# Patient Record
Sex: Female | Born: 1985 | Race: Black or African American | Hispanic: No | Marital: Single | Smoking: Former smoker
Health system: Southern US, Community
[De-identification: ages and names within clinical notes are randomized; demographics above are authoritative.]

## PROBLEM LIST (undated history)

## (undated) ENCOUNTER — Inpatient Hospital Stay (HOSPITAL_COMMUNITY): Payer: Self-pay

## (undated) DIAGNOSIS — D649 Anemia, unspecified: Secondary | ICD-10-CM

## (undated) DIAGNOSIS — K219 Gastro-esophageal reflux disease without esophagitis: Secondary | ICD-10-CM

## (undated) HISTORY — PX: WISDOM TOOTH EXTRACTION: SHX21

---

## 2007-10-13 ENCOUNTER — Inpatient Hospital Stay (HOSPITAL_COMMUNITY): Admission: AD | Admit: 2007-10-13 | Discharge: 2007-10-13 | Payer: Self-pay | Admitting: Obstetrics & Gynecology

## 2008-03-08 ENCOUNTER — Inpatient Hospital Stay (HOSPITAL_COMMUNITY): Admission: AD | Admit: 2008-03-08 | Discharge: 2008-03-08 | Payer: Self-pay | Admitting: Family Medicine

## 2008-05-17 ENCOUNTER — Emergency Department (HOSPITAL_COMMUNITY): Admission: EM | Admit: 2008-05-17 | Discharge: 2008-05-17 | Payer: Self-pay | Admitting: Emergency Medicine

## 2008-07-28 ENCOUNTER — Ambulatory Visit: Payer: Self-pay | Admitting: Obstetrics and Gynecology

## 2008-09-14 ENCOUNTER — Emergency Department (HOSPITAL_COMMUNITY): Admission: EM | Admit: 2008-09-14 | Discharge: 2008-09-14 | Payer: Self-pay | Admitting: Family Medicine

## 2008-09-14 ENCOUNTER — Ambulatory Visit: Payer: Self-pay | Admitting: Obstetrics and Gynecology

## 2008-09-14 ENCOUNTER — Inpatient Hospital Stay (HOSPITAL_COMMUNITY): Admission: AD | Admit: 2008-09-14 | Discharge: 2008-09-14 | Payer: Self-pay | Admitting: Obstetrics & Gynecology

## 2009-01-30 ENCOUNTER — Inpatient Hospital Stay (HOSPITAL_COMMUNITY): Admission: AD | Admit: 2009-01-30 | Discharge: 2009-01-30 | Payer: Self-pay | Admitting: Obstetrics & Gynecology

## 2009-01-30 ENCOUNTER — Encounter: Payer: Self-pay | Admitting: Emergency Medicine

## 2009-03-04 IMAGING — CR DG CHEST 2V
2 series · 2 of 2 positions shown · non-contrast
Comparison: No priors

CLINICAL DATA: Chest tube and abdominal pain

CHEST - 2 VIEW

[w chest pa]
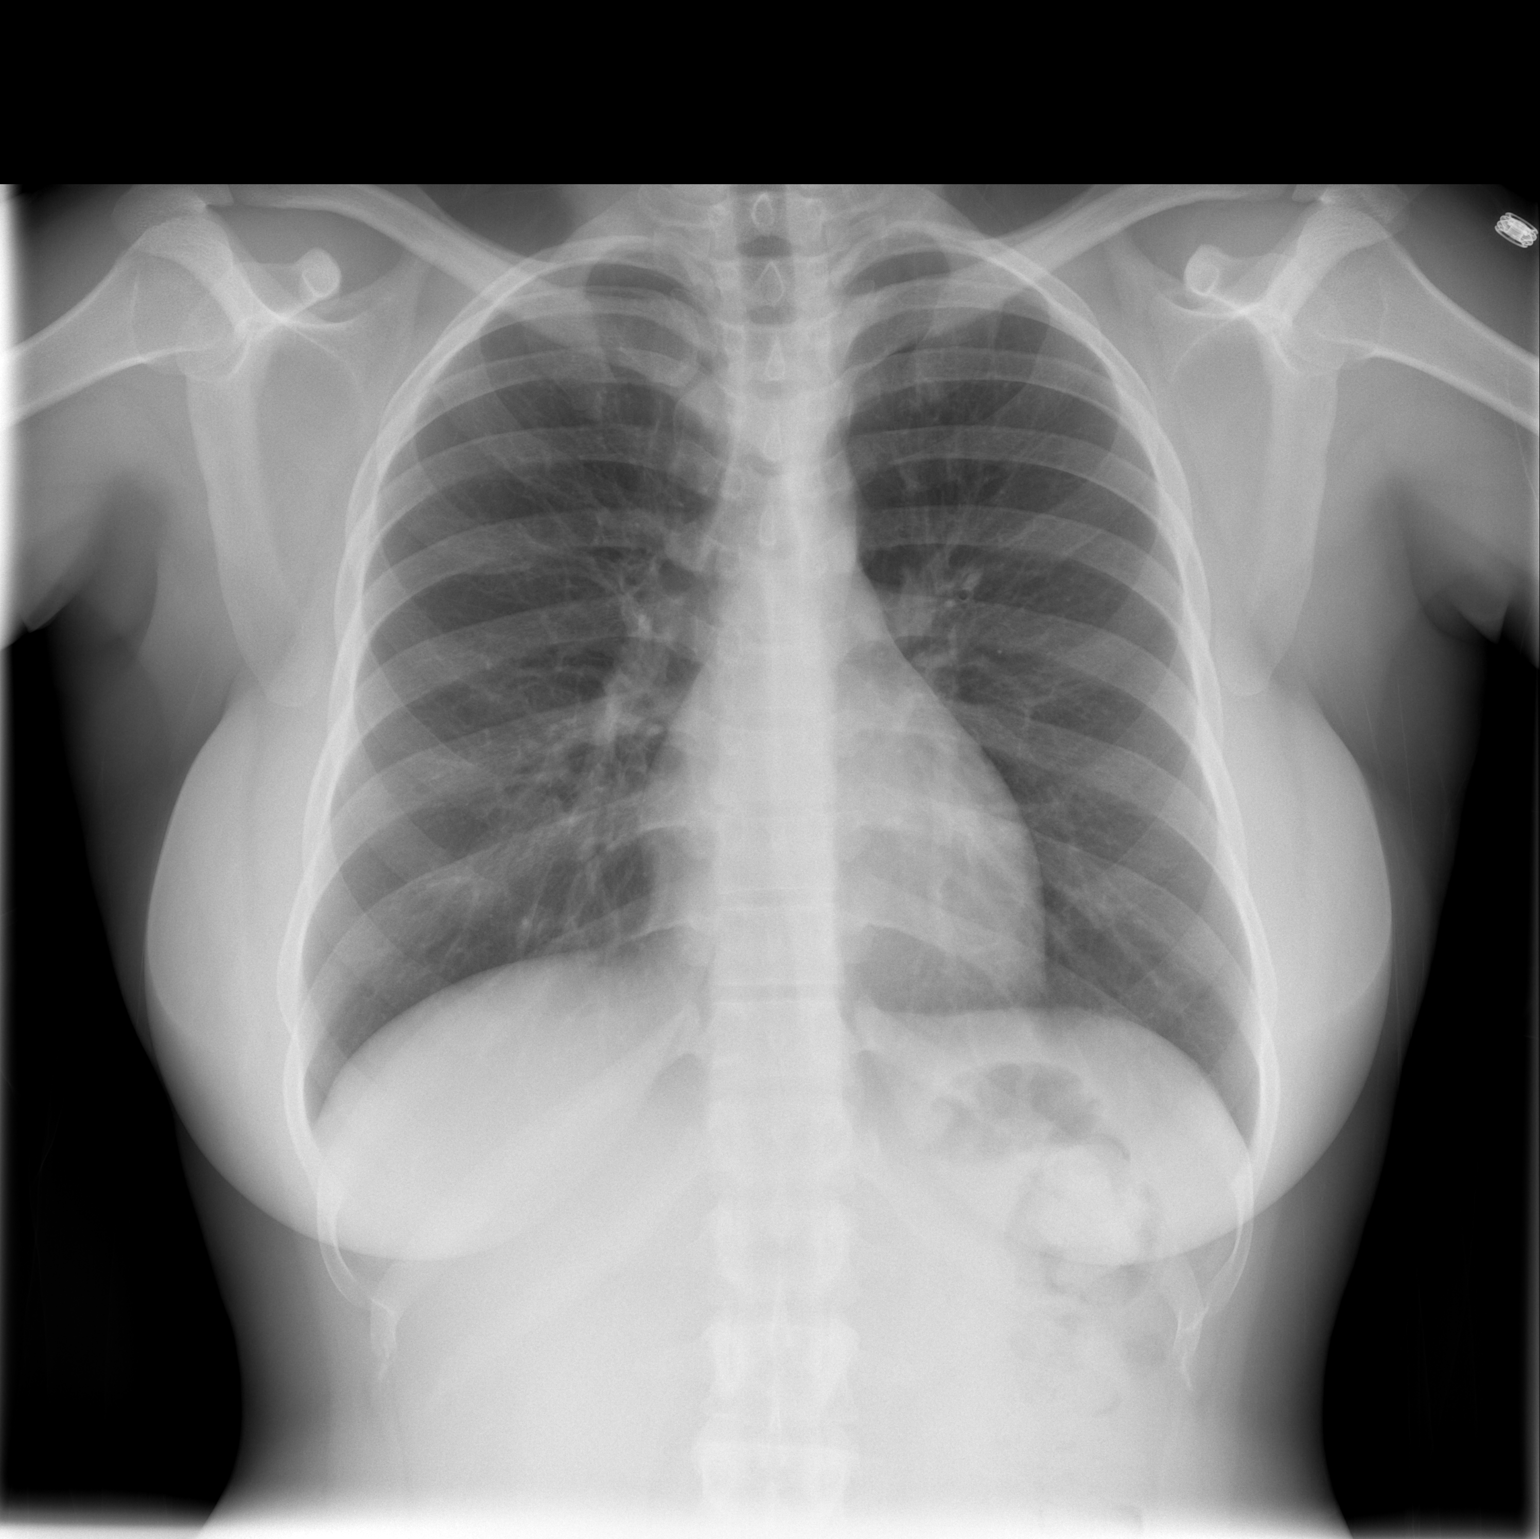

[w chest lat]
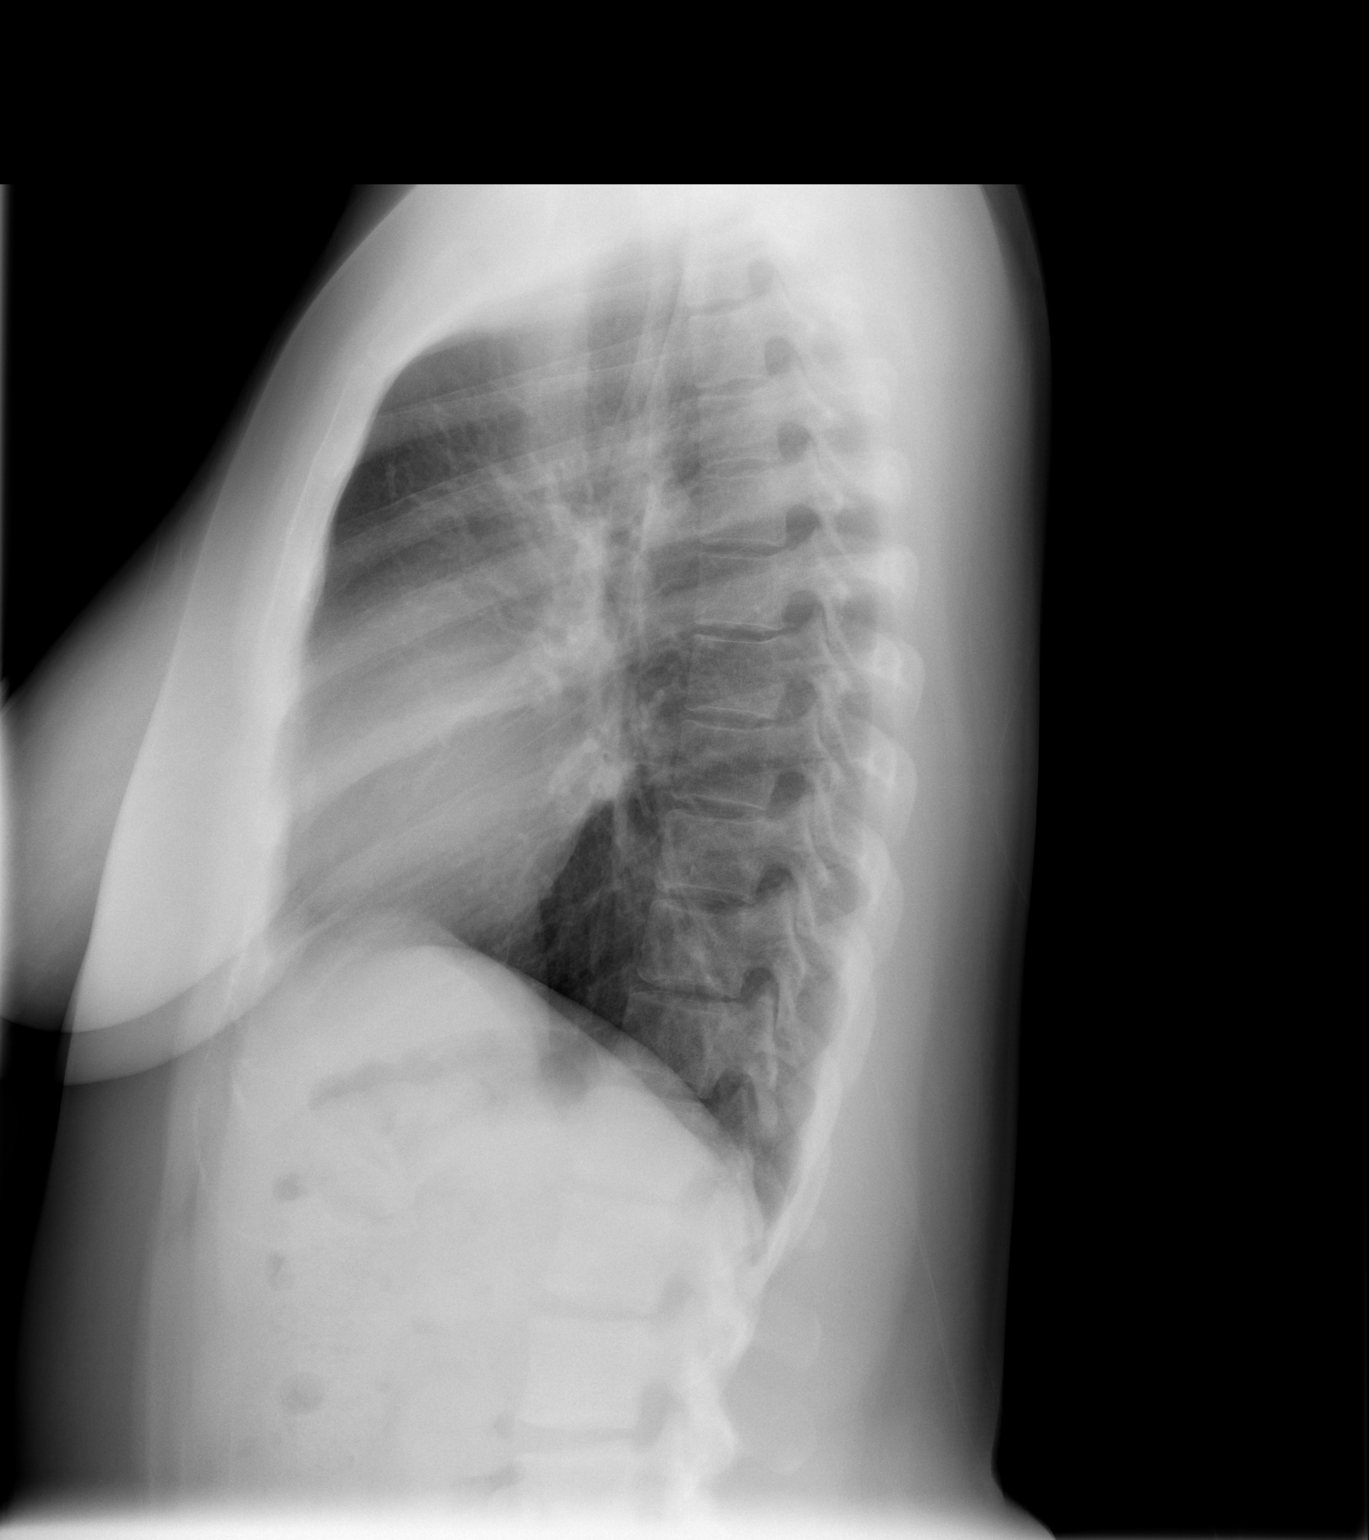

[2 of 2 positions shown; findings below may reference images not displayed]

FINDINGS: Heart and mediastinal contours normal.  Lungs clear.  No
pleural fluid or osseous lesions.
IMPRESSION: No active disease.

## 2009-03-30 ENCOUNTER — Inpatient Hospital Stay (HOSPITAL_COMMUNITY): Admission: AD | Admit: 2009-03-30 | Discharge: 2009-04-01 | Payer: Self-pay | Admitting: Obstetrics and Gynecology

## 2009-07-22 ENCOUNTER — Emergency Department (HOSPITAL_COMMUNITY): Admission: EM | Admit: 2009-07-22 | Discharge: 2009-07-22 | Payer: Self-pay | Admitting: Emergency Medicine

## 2009-11-22 ENCOUNTER — Emergency Department (HOSPITAL_COMMUNITY): Admission: EM | Admit: 2009-11-22 | Discharge: 2009-11-22 | Payer: Self-pay | Admitting: Family Medicine

## 2010-04-25 ENCOUNTER — Ambulatory Visit: Payer: Self-pay | Admitting: Nurse Practitioner

## 2010-04-25 ENCOUNTER — Inpatient Hospital Stay (HOSPITAL_COMMUNITY)
Admission: AD | Admit: 2010-04-25 | Discharge: 2010-04-25 | Payer: Self-pay | Source: Home / Self Care | Admitting: Obstetrics and Gynecology

## 2010-06-13 ENCOUNTER — Emergency Department (HOSPITAL_COMMUNITY): Admission: EM | Admit: 2010-06-13 | Discharge: 2010-06-13 | Payer: Self-pay | Admitting: Emergency Medicine

## 2010-10-08 ENCOUNTER — Inpatient Hospital Stay (HOSPITAL_COMMUNITY)
Admission: AD | Admit: 2010-10-08 | Discharge: 2010-10-08 | Disposition: A | Discharge status: Home / Self Care | Payer: Medicaid Other | Source: Ambulatory Visit | Attending: Obstetrics & Gynecology | Admitting: Obstetrics & Gynecology

## 2010-10-08 ENCOUNTER — Inpatient Hospital Stay (HOSPITAL_COMMUNITY): Payer: Medicaid Other

## 2010-10-08 DIAGNOSIS — O2390 Unspecified genitourinary tract infection in pregnancy, unspecified trimester: Secondary | ICD-10-CM | POA: Insufficient documentation

## 2010-10-08 DIAGNOSIS — R109 Unspecified abdominal pain: Secondary | ICD-10-CM

## 2010-10-08 DIAGNOSIS — N390 Urinary tract infection, site not specified: Secondary | ICD-10-CM | POA: Insufficient documentation

## 2010-10-08 LAB — URINALYSIS, ROUTINE W REFLEX MICROSCOPIC
Ketones, ur: >80 mg/dL — AB
Nitrite: NEGATIVE
Protein, ur: 30 mg/dL — AB
pH: 8.5 — ABNORMAL HIGH (ref 5.0–8.0)

## 2010-10-08 LAB — RAPID URINE DRUG SCREEN, HOSP PERFORMED
Barbiturates: NOT DETECTED
Opiates: NOT DETECTED

## 2010-10-08 LAB — DIFFERENTIAL
Basophils Absolute: 0.1 10*3/uL (ref 0.0–0.1)
Basophils Relative: 1 % (ref 0–1)
Eosinophils Absolute: 0.2 10*3/uL (ref 0.0–0.7)
Eosinophils Relative: 1 % (ref 0–5)
Monocytes Absolute: 0.7 10*3/uL (ref 0.1–1.0)
Monocytes Relative: 6 % (ref 3–12)
Neutro Abs: 8.7 10*3/uL — ABNORMAL HIGH (ref 1.7–7.7)

## 2010-10-08 LAB — CBC
Hemoglobin: 9.9 g/dL — ABNORMAL LOW (ref 12.0–15.0)
MCH: 31.1 pg (ref 26.0–34.0)
MCHC: 33.4 g/dL (ref 30.0–36.0)
RDW: 12.8 % (ref 11.5–15.5)

## 2010-10-08 LAB — ABO/RH: ABO/RH(D): O POS

## 2010-10-08 LAB — RPR: RPR Ser Ql: NONREACTIVE

## 2010-10-08 LAB — HEPATITIS B SURFACE ANTIGEN: Hepatitis B Surface Ag: NEGATIVE

## 2010-10-08 LAB — URINE MICROSCOPIC-ADD ON

## 2010-10-08 LAB — WET PREP, GENITAL

## 2010-10-08 LAB — RUBELLA SCREEN: Rubella: 102.8 IU/mL — ABNORMAL HIGH

## 2010-10-22 LAB — URINE CULTURE: Colony Count: 80000

## 2010-10-25 LAB — URINALYSIS, ROUTINE W REFLEX MICROSCOPIC
Nitrite: NEGATIVE
Protein, ur: NEGATIVE mg/dL
Specific Gravity, Urine: 1.025 (ref 1.005–1.030)
Urobilinogen, UA: 1 mg/dL (ref 0.0–1.0)

## 2010-10-25 LAB — URINE CULTURE
Colony Count: 50000
Culture  Setup Time: 201109150037

## 2010-10-25 LAB — WET PREP, GENITAL

## 2010-10-25 LAB — CBC
HCT: 35.2 % — ABNORMAL LOW (ref 36.0–46.0)
MCH: 32.1 pg (ref 26.0–34.0)
MCHC: 34 g/dL (ref 30.0–36.0)
MCV: 94.3 fL (ref 78.0–100.0)
RDW: 12.3 % (ref 11.5–15.5)
WBC: 13.4 10*3/uL — ABNORMAL HIGH (ref 4.0–10.5)

## 2010-10-25 LAB — URINE MICROSCOPIC-ADD ON

## 2010-10-25 LAB — GC/CHLAMYDIA PROBE AMP, GENITAL: Chlamydia, DNA Probe: NEGATIVE

## 2010-11-17 LAB — CBC
HCT: 36.9 % (ref 36.0–46.0)
Hemoglobin: 10.5 g/dL — ABNORMAL LOW (ref 12.0–15.0)
MCHC: 33.6 g/dL (ref 30.0–36.0)
MCV: 95 fL (ref 78.0–100.0)
Platelets: 288 10*3/uL (ref 150–400)
RDW: 13.1 % (ref 11.5–15.5)
RDW: 13.3 % (ref 11.5–15.5)
WBC: 19.1 10*3/uL — ABNORMAL HIGH (ref 4.0–10.5)

## 2010-11-17 LAB — RPR: RPR Ser Ql: NONREACTIVE

## 2010-11-19 LAB — COMPREHENSIVE METABOLIC PANEL
AST: 21 U/L (ref 0–37)
Albumin: 3 g/dL — ABNORMAL LOW (ref 3.5–5.2)
BUN: 2 mg/dL — ABNORMAL LOW (ref 6–23)
Calcium: 9.2 mg/dL (ref 8.4–10.5)
Chloride: 109 mEq/L (ref 96–112)
Creatinine, Ser: 0.44 mg/dL (ref 0.4–1.2)
GFR calc Af Amer: >60 mL/min (ref >60)
Total Bilirubin: 0.3 mg/dL (ref 0.3–1.2)

## 2010-11-19 LAB — DIFFERENTIAL
Basophils Absolute: 0 10*3/uL (ref 0.0–0.1)
Lymphocytes Relative: 15 % (ref 12–46)
Lymphs Abs: 2 10*3/uL (ref 0.7–4.0)
Monocytes Absolute: 0.7 10*3/uL (ref 0.1–1.0)
Neutro Abs: 10.7 10*3/uL — ABNORMAL HIGH (ref 1.7–7.7)

## 2010-11-19 LAB — URINALYSIS, ROUTINE W REFLEX MICROSCOPIC
Bilirubin Urine: NEGATIVE
Glucose, UA: NEGATIVE mg/dL
Ketones, ur: 40 mg/dL — AB
Nitrite: NEGATIVE
Protein, ur: NEGATIVE mg/dL
Protein, ur: NEGATIVE mg/dL
Specific Gravity, Urine: 1.021 (ref 1.005–1.030)
Urobilinogen, UA: 1 mg/dL (ref 0.0–1.0)
pH: 7 (ref 5.0–8.0)

## 2010-11-19 LAB — WET PREP, GENITAL
Trich, Wet Prep: NONE SEEN
WBC, Wet Prep HPF POC: NONE SEEN
Yeast Wet Prep HPF POC: NONE SEEN

## 2010-11-19 LAB — URINE MICROSCOPIC-ADD ON

## 2010-11-19 LAB — URINE CULTURE

## 2010-11-19 LAB — LIPASE, BLOOD: Lipase: 15 U/L (ref 11–59)

## 2010-11-19 LAB — CBC
HCT: 30 % — ABNORMAL LOW (ref 36.0–46.0)
MCHC: 33.9 g/dL (ref 30.0–36.0)
MCV: 94.3 fL (ref 78.0–100.0)
Platelets: 301 10*3/uL (ref 150–400)
RDW: 12.4 % (ref 11.5–15.5)

## 2010-11-19 LAB — GC/CHLAMYDIA PROBE AMP, GENITAL: Chlamydia, DNA Probe: NEGATIVE

## 2010-11-23 ENCOUNTER — Inpatient Hospital Stay (HOSPITAL_COMMUNITY): Payer: Medicaid Other

## 2010-11-23 ENCOUNTER — Inpatient Hospital Stay (HOSPITAL_COMMUNITY)
Admission: AD | Admit: 2010-11-23 | Discharge: 2010-11-25 | DRG: 775 | Disposition: A | Discharge status: Home / Self Care | Payer: Medicaid Other | Source: Ambulatory Visit | Attending: Obstetrics and Gynecology | Admitting: Obstetrics and Gynecology

## 2010-11-23 ENCOUNTER — Emergency Department (HOSPITAL_COMMUNITY)
Admission: EM | Admit: 2010-11-23 | Discharge: 2010-11-23 | Disposition: A | Discharge status: Skilled Nursing Facility | Payer: Medicaid Other | Attending: Emergency Medicine | Admitting: Emergency Medicine

## 2010-11-23 ENCOUNTER — Other Ambulatory Visit: Payer: Self-pay | Admitting: Obstetrics and Gynecology

## 2010-11-23 DIAGNOSIS — R109 Unspecified abdominal pain: Secondary | ICD-10-CM | POA: Insufficient documentation

## 2010-11-23 DIAGNOSIS — O4700 False labor before 37 completed weeks of gestation, unspecified trimester: Secondary | ICD-10-CM | POA: Insufficient documentation

## 2010-11-23 DIAGNOSIS — O0930 Supervision of pregnancy with insufficient antenatal care, unspecified trimester: Secondary | ICD-10-CM

## 2010-11-23 LAB — CBC
HCT: 34.3 % — ABNORMAL LOW (ref 36.0–46.0)
MCH: 31 pg (ref 26.0–34.0)
MCHC: 32.9 g/dL (ref 30.0–36.0)
MCV: 94 fL (ref 78.0–100.0)
RDW: 12.6 % (ref 11.5–15.5)

## 2010-11-24 LAB — CBC
HCT: 27.5 % — ABNORMAL LOW (ref 36.0–46.0)
Hemoglobin: 9.1 g/dL — ABNORMAL LOW (ref 12.0–15.0)
MCV: 93.2 fL (ref 78.0–100.0)
Platelets: 247 10*3/uL (ref 150–400)
RBC: 2.95 MIL/uL — ABNORMAL LOW (ref 3.87–5.11)
WBC: 19.2 10*3/uL — ABNORMAL HIGH (ref 4.0–10.5)

## 2010-11-27 LAB — POCT PREGNANCY, URINE: Preg Test, Ur: POSITIVE

## 2010-11-27 LAB — WET PREP, GENITAL

## 2010-11-27 LAB — POCT URINALYSIS DIP (DEVICE)
Protein, ur: 30 mg/dL — AB
Specific Gravity, Urine: 1.02 (ref 1.005–1.030)
Urobilinogen, UA: 1 mg/dL (ref 0.0–1.0)

## 2010-11-27 LAB — GC/CHLAMYDIA PROBE AMP, GENITAL: Chlamydia, DNA Probe: NEGATIVE

## 2010-11-29 NOTE — Discharge Summary (Signed)
[name], [name]          ACCOUNT NO.:  [id number]  MEDICAL RECORD NO.:  [id number]           PATIENT TYPE:  I  LOCATION:  9124                          [facility]  PHYSICIAN:  Huel Cote, M.D. DATE OF BIRTH:  05-22-1986  DATE OF ADMISSION:  11/23/2010 DATE OF DISCHARGE:  11/25/2010                              DISCHARGE SUMMARY   DISCHARGE DIAGNOSES: 1. Term pregnancy at 37 plus weeks delivered. 2. The patient had no prenatal care and presented in labor. 3. Status post normal spontaneous vaginal delivery.  DISCHARGE MEDICATIONS: 1. Motrin 600 mg p.o. every 6 hours. 2. Percocet 1-2 tablets p.o. every 4 hours p.r.n. 3. Depo-Provera 150 mg IM prior to discharge.  DISCHARGE FOLLOWUP:  The patient is to follow up in the office in 6 weeks.  HOSPITAL COURSE:  The patient is a 25 year old G4, P 2-0-1-2, who presented to the [facility] ER at 37 plus weeks' gestation.  Her due date was December 09, 2010, by an ultrasound performed in the [facility] Emergency room in September 2011 which demonstrated 7-week gestation, at that point, she had no further prenatal care except for one other ER visit in October 08, 2010.  She did have some basic prenatal labs drawn at that visit and an ultrasound which revealed a baby that was probably slightly growth restricted that had normal anatomy.  She stated that she tried to get in with the health department for prenatal care after that point, but was unsuccessful and got rescheduled twice.  She had a positive marijuana screen noted on her visit in February, but denied any other substance abuse.  Her prenatal labs are as follows: O positive, antibody negative, RPR nonreactive, hepatitis B surface antigen negative, HIV negative, rubella immune, GC negative, chlamydia negative, group B strep was unknown.  PAST OBSTETRICAL HISTORY:  She had one spontaneous miscarriage in 2010. She had a vaginal delivery of a 5-pound 3-ounce infant at term.   In 2001, she had a vaginal delivery of a 5-pound 5-ounce infant at term.  PAST GYN HISTORY:  No abnormal Pap smears.  PAST MEDICAL HISTORY:  Denied by the patient.  PAST SURGICAL HISTORY:  Denied by the patient.  ALLERGIES:  None.  MEDICATIONS:  Tylenol.  SOCIAL:  The patient stays at home.  She denies tobacco use.  She did admit to some marijuana use before she realized she was pregnant and has no other drugs.  Then denied alcohol use.  On admission, she was afebrile with stable vital signs.  She was seen in the emergency room at [facility] and was felt to be possibly laboring with a cervix that was 4-5 cm dilated, and therefore, was transferred to [facility] for care.  She declined epidural, did receive Nubain x2 while in labor and went on to have a normal spontaneous vaginal delivery of a viable female infant over an intact perineum.  Apgars were 8 and 9, weight was 4 pounds 10 ounces.  Placenta delivery was spontaneous and was sent to pathology.  Baby had a strong cry and was vigorous after delivery and was sent to the regular nursery where he was observed closely and  also was going to have urine drug screening.  Estimated blood loss for the patient was 350 mL.  She was then admitted for routine postpartum care. She did have a social work consult and the baby did have drug screening as stated.  On postpartum day #2, she was doing quite well.  She was tolerating her pain with Motrin and Percocet.  She was given prescriptions for these as well as Depo-Provera injection prior to discharge for birth control.  Her bleeding was normal.  She was discharged to home to follow up in the office in 6 weeks for postpartum exam or if desire in the next week and a half for circumcision for her baby.     Huel Cote, M.D.     KR/MEDQ  D:  11/25/2010  T:  11/25/2010  Job:  [id number]  Electronically Signed by Huel Cote M.D. on 11/29/2010 06:55:36 PM

## 2010-12-25 NOTE — Group Therapy Note (Signed)
[name], COONROD           ACCOUNT NO.:  [id number]   MEDICAL RECORD NO.:  [id number]          PATIENT TYPE:  WOC   LOCATION:  [facility]                   FACILITY:  [facility]   PHYSICIAN:  Argentina Donovan, MD        DATE OF BIRTH:  08-08-1986   DATE OF SERVICE:  07/28/2008                                  CLINIC NOTE   CHIEF COMPLAINT:  Irregular periods.   HISTORY OF PRESENT ILLNESS:  This is a 25 year old female who presents  for 6 months of irregular bleeding.  Her last normal period was in June,  2009.  Then for the next 6 months, she had a day or 2 of light pink  spotting about once a month.  Then during November, on July 08, 2008.  She started to have a very heavy, painful period.  She was  passing clots that lasted 4 days.  She has not had any bleeding since  that time.  She was seen in the ER for this irregular bleeding, where  they did a Pap smear, which per the patient was normal.  Her pregnancy  test was negative.   She is trying to get pregnant and has been actively trying for about 2  months.  She was not on any form of hormone or contraception prior to  this.  She denies any current abdominal pain or vaginal discharge.   MENSTRUAL HISTORY:  Her menstrual cycles began at 11.  She had normal  menstrual cycles until June of this year.   OBSTETRICAL HISTORY:  Patient is a G2, P1-0-0-1.  Her last pregnancy was  7 years ago.  It was a normal spontaneous vaginal delivery without  complication.  She had 1 miscarriage, 2 years ago.   GYNECOLOGIC HISTORY:  Her last Pap smear was on November 26th in the  emergency room in [place].  She reports that this was normal.  She had  an abnormal Pap smear 3 years ago and had a repeat Pap following this  that was normal.   ALLERGIES:  No known drug allergies.   CURRENT MEDICATIONS:  Prilosec as needed.   FAMILY HISTORY:  Positive for diabetes and high blood pressure in her  grandmother.   SOCIAL HISTORY:  Patient recently  moved to [place] from [place].  She does not smoke, drink alcohol, or use drugs.   Ten systems reviewed and were all negative.   PHYSICAL EXAMINATION:  Temperature 96.9, pulse 82, blood pressure  117/68, weight 147.9 pounds.  Height is 5 foot 0.  GENERAL:  Patient is an overweight, pleasant female in no acute  distress.  GYN:  Patient has normal external genitalia, normal vaginal mucosa  without discharge.  No cervical lesions or tenderness present.  No  vaginal bleeding present.  Uterus is normal size and nontender.  Ovaries  without masses or tenderness.   ASSESSMENT:  This is a 25 year old female who presents for dysfunctional  uterine bleeding.   PLAN:  We will check a thyroid hormone to insure this is not the cause  of her regular bleeding.  It is reassuring that she had a heavy  withdrawal  bleed in November.  She is trying to get pregnant.  It is  advised that she start prenatal vitamins and keep a record of her sexual  activity and of her menstrual cycles for the next 3-4 months and likely,  she will start to ovulate regularly and will be successful  in getting  pregnant.  We will consider any further workup when she returns in 3-4  months if she continues to have irregular periods.     ______________________________  Argentina Donovan, MD    ______________________________  Argentina Donovan, MD    PR/MEDQ  D:  07/28/2008  T:  07/28/2008  Job:  [id number]

## 2010-12-25 NOTE — Discharge Summary (Signed)
[name], [name]          ACCOUNT NO.:  [id number]   MEDICAL RECORD NO.:  [id number]          PATIENT TYPE:  INP   LOCATION:  9104                          [facility]   PHYSICIAN:  Huel Cote, M.D. DATE OF BIRTH:  25-Feb-1986   DATE OF ADMISSION:  03/30/2009  DATE OF DISCHARGE:  04/01/2009                               DISCHARGE SUMMARY   ADDENDUM:  The patient stayed until April 01, 2009, for the pediatric  request that the baby stay an additional day.  On postpartum day #2, the  patient was still doing quite well.  She had no complaints and was ready  for discharge.  Fundus was firm.  She had been given prescriptions and  instructions by Dr. Ellyn Hack previously with Motrin and Vicodin ordered on  her prescriptions.  She was instructed on pelvic rest and to follow up  in the office in 6 weeks for her full postpartum exam.      Huel Cote, M.D.  Electronically Signed     KR/MEDQ  D:  04/01/2009  T:  04/02/2009  Job:  [id number]

## 2010-12-25 NOTE — Discharge Summary (Signed)
[name], [name]          ACCOUNT NO.:  [id number]   MEDICAL RECORD NO.:  [id number]          PATIENT TYPE:  INP   LOCATION:  9104                          [facility]   PHYSICIAN:  Sherron Monday, MD        DATE OF BIRTH:  [birth year]   DATE OF ADMISSION:  03/30/2009  DATE OF DISCHARGE:                               DISCHARGE SUMMARY   ADMITTING DIAGNOSIS:  Intrauterine pregnancy in active labor.   DISCHARGE DIAGNOSIS:  Intrauterine pregnancy in active labor, delivered.   HISTORY OF PRESENT ILLNESS:  A 25 year old, G3, P1-0-1-1, at 37 plus  weeks in active labor.  She states she has had good fetal movement, no  loss of fluid, no vaginal bleeding.  Her contractions have increased in  intensity and frequency.  To this point, she has had uncomplicated  prenatal care and presents to the [facility] with cervical change to 6 cm from  a check in our office.   Past medical history is significant for acid reflux.   Surgical history is not significant.   PAST OB-GYN HISTORY:  G1 was term vaginal delivery of a female infant  without complications.  G2 was a miscarriage.  G3 is present pregnancy.  She has a history of an abnormal Pap smear with repeat followup normal.  No history of any sexually transmitted diseases.   Medications include prenatal vitamins and Tylenol p.r.n.   ALLERGIES:  No known drug allergies.   SOCIAL HISTORY:  The patient denies alcohol, tobacco, or drug use.  She  is single and a homemaker.   Family history is significant for hypertension in maternal grandmother  and coronary artery disease in maternal grandmother.   PRENATAL LABORATORY:  Hemoglobin 10.8, platelets 314,000.  O positive,  antibody screen negative.  Sickle cell normal limits.  Pap smear is  within normal limits.  Gonorrhea negative.  Chlamydia negative.  RPR  nonreactive.  Rubella immune.  Hepatitis C surface antigen negative.  HIV negative.  AFP negative.  First trimester screen within normal  limits.  AFP within normal limits.  Glucola of 89.  Group B strep was  negative.  She had normal ultrasound including a normal nuchal  thickness.  She had a scan in 19 weeks revealing normal anatomy, except  limited base, anterior placenta, female infant.  Followup ultrasound on  22 weeks revealed normal anatomy, anterior placenta, female infant was  consistent with an EDC of April 14, 2009.   PHYSICAL EXAMINATION:  She is afebrile.  Vital signs stable with a  benign exam.  Fetal heart tones are in the 120s and reactive with  contractions every 3 to 4 minutes.   On exam by the MD, she was 8-9 cm dilated, 90% effaced, and 0 to +1  station.  Rupture of membranes was performed without complications for  clear fluid.  The patient progressed rapidly to the anterior lip,  complete +2, complaining of severe pressure.  At this time, she pushed  twice and the anterior lip was reduced.  She pushed for 3 more  contractions to deliver a viable female infant at 5:40 a.m. with Apgars  of 8 at 1 minute and 9 at 5 minutes and a weight of 5 pounds 4 ounces.  Infant delivered from the OA presentation.  Placenta was expressed  intact at 5:43.  No OB lacerations except a left labial abrasion that  was hemostatic.  EBL was approximately 500 mL.  Her postpartum course  was relatively uncomplicated.  She remained afebrile.  Vital signs  stable with normal lochia.  Pain was well controlled.  She was  ambulating well and voiding.  Her hemoglobin decreased from 12.4 to  10.5.  She desired discharge to home on postpartum day #1 as well as the  baby is discharged.  She will be discharged with routine instructions  and numbers to call with any questions or problems as well as  prescriptions for Motrin, Vicodin, and prenatal vitamins.  She will  follow up in approximately 6 weeks.   DISCHARGE INFORMATION:  She plans to bottle feed.  She will started OCPs  at her 6-week checkup.  She is O positive, rubella  immune, and her  hemoglobin decreased from 12.4-10.5.  n      Sherron Monday, MD  Electronically Signed     JB/MEDQ  D:  03/31/2009  T:  04/01/2009  Job:  [id number]

## 2011-05-06 LAB — URINALYSIS, ROUTINE W REFLEX MICROSCOPIC
Glucose, UA: NEGATIVE
Hgb urine dipstick: NEGATIVE
Ketones, ur: 15 — AB
Protein, ur: NEGATIVE

## 2011-05-06 LAB — COMPREHENSIVE METABOLIC PANEL
BUN: 9
CO2: 28
Calcium: 9.2
Creatinine, Ser: 0.54
GFR calc non Af Amer: >60
Glucose, Bld: 95

## 2011-05-06 LAB — CBC
HCT: 34.4 — ABNORMAL LOW
Hemoglobin: 11.7 — ABNORMAL LOW
MCHC: 33.9
MCV: 93.7
RBC: 3.68 — ABNORMAL LOW

## 2011-05-06 LAB — WET PREP, GENITAL: Trich, Wet Prep: NONE SEEN

## 2011-05-10 LAB — COMPREHENSIVE METABOLIC PANEL
BUN: 7
CO2: 26
Chloride: 105
Creatinine, Ser: 0.53
GFR calc non Af Amer: >60
Glucose, Bld: 87
Total Bilirubin: 0.4

## 2011-05-10 LAB — URINALYSIS, ROUTINE W REFLEX MICROSCOPIC
Glucose, UA: NEGATIVE
Protein, ur: NEGATIVE
Urobilinogen, UA: 0.2

## 2011-05-10 LAB — WET PREP, GENITAL: Yeast Wet Prep HPF POC: NONE SEEN

## 2011-05-10 LAB — CBC
HCT: 34 — ABNORMAL LOW
MCV: 94.8
RBC: 3.59 — ABNORMAL LOW
WBC: 10

## 2011-05-10 LAB — POCT PREGNANCY, URINE: Preg Test, Ur: NEGATIVE

## 2011-05-14 LAB — POCT I-STAT, CHEM 8
BUN: 7
Calcium, Ion: 1.32
Creatinine, Ser: 0.7
Glucose, Bld: 81
Hemoglobin: 12.9
Sodium: 140
TCO2: 27

## 2011-05-14 LAB — HEPATIC FUNCTION PANEL
ALT: 16
AST: 23
Alkaline Phosphatase: 36 — ABNORMAL LOW
Bilirubin, Direct: 0.1
Total Bilirubin: 0.5

## 2011-05-14 LAB — CBC
Hemoglobin: 11.6 — ABNORMAL LOW
MCHC: 33.6
RBC: 3.68 — ABNORMAL LOW
WBC: 8.8

## 2011-05-14 LAB — URINE MICROSCOPIC-ADD ON

## 2011-05-14 LAB — DIFFERENTIAL
Basophils Relative: 1
Lymphs Abs: 2.5
Monocytes Absolute: 0.7
Monocytes Relative: 8
Neutro Abs: 5.1

## 2011-05-14 LAB — URINALYSIS, ROUTINE W REFLEX MICROSCOPIC
Glucose, UA: NEGATIVE
Ketones, ur: NEGATIVE
Leukocytes, UA: NEGATIVE
Nitrite: NEGATIVE
Protein, ur: NEGATIVE

## 2011-05-14 LAB — POCT PREGNANCY, URINE: Preg Test, Ur: NEGATIVE

## 2011-11-09 ENCOUNTER — Encounter (HOSPITAL_COMMUNITY): Payer: Self-pay

## 2011-11-09 ENCOUNTER — Emergency Department (HOSPITAL_COMMUNITY): Payer: Medicaid Other

## 2011-11-09 ENCOUNTER — Other Ambulatory Visit: Payer: Self-pay

## 2011-11-09 ENCOUNTER — Emergency Department (HOSPITAL_COMMUNITY)
Admission: EM | Admit: 2011-11-09 | Discharge: 2011-11-09 | Disposition: A | Discharge status: Home / Self Care | Payer: Medicaid Other | Attending: Emergency Medicine | Admitting: Emergency Medicine

## 2011-11-09 DIAGNOSIS — R0789 Other chest pain: Secondary | ICD-10-CM

## 2011-11-09 DIAGNOSIS — R071 Chest pain on breathing: Secondary | ICD-10-CM | POA: Insufficient documentation

## 2011-11-09 DIAGNOSIS — F17200 Nicotine dependence, unspecified, uncomplicated: Secondary | ICD-10-CM | POA: Insufficient documentation

## 2011-11-09 DIAGNOSIS — Z79899 Other long term (current) drug therapy: Secondary | ICD-10-CM | POA: Insufficient documentation

## 2011-11-09 DIAGNOSIS — R51 Headache: Secondary | ICD-10-CM

## 2011-11-09 MED ORDER — IBUPROFEN 800 MG PO TABS: 800.0000 mg | ORAL_TABLET | Freq: Three times a day (TID) | ORAL | Status: AC

## 2011-11-09 MED ORDER — KETOROLAC TROMETHAMINE 60 MG/2ML IM SOLN
60.0000 mg | Freq: Once | INTRAMUSCULAR | Status: AC
  Administered 2011-11-09: 60 mg via INTRAMUSCULAR
  Filled 2011-11-09: qty 2

## 2011-11-09 NOTE — ED Notes (Signed)
Migraine began yesterday  and chest pain began about 1 week ago .

## 2011-11-09 NOTE — ED Notes (Signed)
Patient states she is having chest pain x 1 week and headache x 3 days. Patient states when she picks up her son her chest hurts. Patient states D/Vx 2 days and states she is depovera shot and does not think she is pregnant.

## 2011-11-09 NOTE — ED Provider Notes (Signed)
History     CSN: [id number]  Arrival date & time 11/09/11  6440   First MD Initiated Contact with Patient 11/09/11 [id number]      Chief Complaint  Patient presents with  . Migraine    (Consider location/radiation/quality/duration/timing/severity/associated sxs/prior treatment) HPI History from patient. 26 year old female who is otherwise healthy presents with complaint of headache and chest pain. She states that she developed the chest pain about a week ago. Pain is sharp and stabbing and lasts a few seconds at a time, located to R chest wall. This seems to get worse with movement and when she is lifting her child. States it does feel somewhat like a muscle strain. Occasionally does seem to be worse with deep breaths but not consistently. It is not positional in nature. Denies associated shortness of breath, nausea, vomiting. No associated abdominal pain. No treatments prior to arrival.  Patient has no prior history of DVT/PE or clotting disorders. Denies recent trauma, surgery, or prolonged immobilization. Denies hemoptysis, leg swelling. She is on Depo.  She additionally complains of headache which started yesterday. The headache is unilateral, located in the frontal area and behind her right eye. It is described as throbbing in nature. Seems to vary in intensity with no known aggravating factors. She is taking Tylenol which did not successfully alleviate the pain. No associated nausea, vomiting, photophobia, phonophobia. She has not had any neck pain or stiffness. No fever or chills. No history of migraine, trauma, or recent URI sx. No weakness, numbness, myalgias.  History reviewed. No pertinent past medical history.  No past surgical history on file.  No family history on file.  History  Substance Use Topics  . Smoking status: Current Everyday Smoker  . Smokeless tobacco: Not on file  . Alcohol Use: No    OB History    Grav Para Term Preterm Abortions TAB SAB Ect Mult Living               Review of Systems as per HPI  Allergies  Review of patient's allergies indicates no known allergies.  Home Medications   Current Outpatient Rx  Name Route Sig Dispense Refill  . ACETAMINOPHEN 500 MG PO TABS Oral Take 1,000 mg by mouth every 6 (six) hours as needed. For pain    . HYDROCORTISONE 1 % EX CREA Topical Apply 1 application topically as needed. For eczema on face    . MEDROXYPROGESTERONE ACETATE 150 MG/ML IM SUSP Intramuscular Inject 150 mg into the muscle every 3 (three) months. Last injection February 13th, 2013      BP 126/67  Pulse 72  Temp(Src) 98.6 F (37 C) (Oral)  Resp 18  SpO2 100%  Physical Exam  Nursing note and vitals reviewed. Constitutional: She is oriented to person, place, and time. She appears well-developed and well-nourished. No distress.       nontox  HENT:  Head: Normocephalic and atraumatic.  Right Ear: External ear normal.  Left Ear: External ear normal.  Nose: Nose normal.  Mouth/Throat: Oropharynx is clear and moist. No oropharyngeal exudate.       Head not TTP, no sinus tenderness, TMs nl b/l  Eyes: Conjunctivae and EOM are normal. Pupils are equal, round, and reactive to light.  Neck: Normal range of motion. Neck supple.       No meningeal signs  Cardiovascular: Normal rate, regular rhythm and normal heart sounds.   Pulmonary/Chest: Effort normal and breath sounds normal. No respiratory distress. She has no wheezes. She exhibits tenderness.  Reproducible chest pain to R anterior upper chest  Abdominal: Soft. Bowel sounds are normal. There is no tenderness.  Musculoskeletal: Normal range of motion. She exhibits no edema.       No periph edema, LEs soft and NT to palp, no palp cord  Lymphadenopathy:    She has no cervical adenopathy.  Neurological: She is alert and oriented to person, place, and time. She displays normal reflexes. No cranial nerve deficit. She exhibits normal muscle tone. Coordination normal.    Skin: Skin is warm and dry. No rash noted. She is not diaphoretic.  Psychiatric: She has a normal mood and affect.    ED Course  Procedures (including critical care time)   Date: 11/09/2011  Rate: 80  Rhythm: normal sinus rhythm  QRS Axis: normal  Intervals: normal  ST/T Wave abnormalities: flipped T waves in V1 only, no other ST changes  Conduction Disutrbances:none  Narrative Interpretation:   Old EKG Reviewed: compared with June 2010, T wave change not seen but otherwise unchanged   Labs Reviewed  PREGNANCY, URINE   Dg Chest 2 View  11/09/2011  *RADIOLOGY REPORT*  Clinical Data: Right-sided chest pain for the past week.  Shortness of breath.  CHEST - 2 VIEW  Comparison: Chest x-ray 05/17/2008.  Findings: Lung volumes are normal.  No consolidative airspace disease.  No pleural effusions.  No pneumothorax.  No pulmonary nodule or mass noted.  Pulmonary vasculature and the cardiomediastinal silhouette are within normal limits.  IMPRESSION: 1. No radiographic evidence of acute cardiopulmonary disease.  Original Report Authenticated By: Florencia Reasons, M.D.     1. Chest wall pain   2. Headache       MDM  Patient with complaint of chest pain for the past week as well as headache for the past day. Her chest pain is very reproducible to palpation. Her EKG does not have any worrisome changes as compared with old. Chest x-ray normal. She has no risk factors for PE other than exogenous estrogen use. Her vital signs are normal. The pain does not truly seem pleuritic in nature. Therefore, I do not have high suspicion for PE and did not feel that further investigation was necessary.  Regarding her headache, she does not have any neurologic findings on exam. Seems to be likely tension headache given history. Do not feel that she requires imaging at this time.  She was given Toradol in the department which she stated completely relieved her headache and vastly improved her chest pain.  On repeat exam, she was noted to have considerably less tenderness to palpation to her chest. Will treat symptomatically with ibuprofen. Return precautions discussed.       Grant Fontana, [place] 11/09/11 1234

## 2011-11-09 NOTE — ED Notes (Signed)
Patient transported to x-ray. ?

## 2011-11-09 NOTE — Discharge Instructions (Signed)
Your chest xray and EKG did not show any worrisome findings. Your chest pain is likely chest wall pain related to the muscles/cartilage in your chest. Use the ibuprofen as needed for pain. If your symptoms worsen, please return to the ER.  Chest Wall Pain Chest wall pain is pain in or around the bones and muscles of your chest. It may take up to 6 weeks to get better. It may take longer if you must stay physically active in your work and activities.  CAUSES  Chest wall pain may happen on its own. However, it may be caused by:  A viral illness like the flu.   Injury.   Coughing.   Exercise.   Arthritis.   Fibromyalgia.   Shingles.  HOME CARE INSTRUCTIONS   Avoid overtiring physical activity. Try not to strain or perform activities that cause pain. This includes any activities using your chest or your abdominal and side muscles, especially if heavy weights are used.   Put ice on the sore area.   Put ice in a plastic bag.   Place a towel between your skin and the bag.   Leave the ice on for 15 to 20 minutes per hour while awake for the first 2 days.   Only take over-the-counter or prescription medicines for pain, discomfort, or fever as directed by your caregiver.  SEEK IMMEDIATE MEDICAL CARE IF:   Your pain increases, or you are very uncomfortable.   You have a fever.   Your chest pain becomes worse.   You have new, unexplained symptoms.   You have nausea or vomiting.   You feel sweaty or lightheaded.   You have a cough with phlegm (sputum), or you cough up blood.  MAKE SURE YOU:   Understand these instructions.   Will watch your condition.   Will get help right away if you are not doing well or get worse.  Document Released: 07/29/2005 Document Revised: 07/18/2011 Document Reviewed: 03/25/2011 [facility] Patient Information 2012 [place], [place].Headache, General, Unknown Cause The specific cause of your headache may not have been found today. There are many  causes and types of headache. A few common ones are:  Tension headache.   Migraine.   Infections (examples: dental and sinus infections).   Bone and/or joint problems in the neck or jaw.   Depression.   Eye problems.  These headaches are not life threatening.  Headaches can sometimes be diagnosed by a patient history and a physical exam. Sometimes, lab and imaging studies (such as x-ray and/or CT scan) are used to rule out more serious problems. In some cases, a spinal tap (lumbar puncture) may be requested. There are many times when your exam and tests may be normal on the first visit even when there is a serious problem causing your headaches. Because of that, it is very important to follow up with your doctor or local clinic for further evaluation. FINDING OUT THE RESULTS OF TESTS  If a radiology test was performed, a radiologist will review your results.   You will be contacted by the emergency department or your physician if any test results require a change in your treatment plan.   Not all test results may be available during your visit. If your test results are not back during the visit, make an appointment with your caregiver to find out the results. Do not assume everything is normal if you have not heard from your caregiver or the medical facility. It is important for you to  follow up on all of your test results.  HOME CARE INSTRUCTIONS   Keep follow-up appointments with your caregiver, or any specialist referral.   Only take over-the-counter or prescription medicines for pain, discomfort, or fever as directed by your caregiver.   Biofeedback, massage, or other relaxation techniques may be helpful.   Ice packs or heat applied to the head and neck can be used. Do this three to four times per day, or as needed.   Call your doctor if you have any questions or concerns.   If you smoke, you should quit.  SEEK MEDICAL CARE IF:   You develop problems with medications  prescribed.   You do not respond to or obtain relief from medications.   You have a change from the usual headache.   You develop nausea or vomiting.  SEEK IMMEDIATE MEDICAL CARE IF:   If your headache becomes severe.   You have an unexplained oral temperature above 102 F (38.9 C), or as your caregiver suggests.   You have a stiff neck.   You have loss of vision.   You have muscular weakness.   You have loss of muscular control.   You develop severe symptoms different from your first symptoms.   You start losing your balance or have trouble walking.   You feel faint or pass out.  MAKE SURE YOU:   Understand these instructions.   Will watch your condition.   Will get help right away if you are not doing well or get worse.  Document Released: 07/29/2005 Document Revised: 07/18/2011 Document Reviewed: 03/17/2008 [facility] Patient Information 2012 [place], [place].

## 2011-11-09 NOTE — ED Notes (Signed)
Patient returned from xray.

## 2011-11-10 NOTE — ED Provider Notes (Signed)
Medical screening examination/treatment/procedure(s) were performed by non-physician practitioner and as supervising physician I was immediately available for consultation/collaboration.  Toy Baker, MD 11/10/11 [phone]

## 2012-12-06 LAB — OB RESULTS CONSOLE HIV ANTIBODY (ROUTINE TESTING): HIV: NONREACTIVE

## 2012-12-06 LAB — OB RESULTS CONSOLE HEPATITIS B SURFACE ANTIGEN: HEP B S AG: NEGATIVE

## 2012-12-06 LAB — OB RESULTS CONSOLE RPR: RPR: NONREACTIVE

## 2012-12-06 LAB — OB RESULTS CONSOLE ANTIBODY SCREEN: Antibody Screen: NEGATIVE

## 2012-12-06 LAB — OB RESULTS CONSOLE GC/CHLAMYDIA
Chlamydia: NEGATIVE
Gonorrhea: NEGATIVE

## 2012-12-06 LAB — OB RESULTS CONSOLE ABO/RH: RH TYPE: POSITIVE

## 2012-12-06 LAB — OB RESULTS CONSOLE RUBELLA ANTIBODY, IGM: Rubella: IMMUNE

## 2013-08-12 NOTE — L&D Delivery Note (Signed)
Delivery Note Pt progressed to complete dilation and pushed well.  SROM occurred at some point after admitted.  At 10:50 AM a healthy female was delivered via  NSVD(Presentation: LOA).  APGAR:8 , 9; weight pending .   Placenta status: delivered spontaneously, .  Cord:  with the following complications: tight nuchal x 1 delivered through .    Anesthesia: None  Episiotomy: none Lacerations: small right periurethral, hemostatic Suture Repair:  n/a Est. Blood Loss (mL): 300cc  Mom to postpartum.  Baby to Couplet care / Skin to Skin.  Oliver Pila 01/09/2014, 11:03 AM

## 2013-11-28 ENCOUNTER — Inpatient Hospital Stay (HOSPITAL_COMMUNITY)
Admission: AD | Admit: 2013-11-28 | Discharge: 2013-11-29 | Disposition: A | Discharge status: Home / Self Care | Payer: Medicaid Other | Source: Ambulatory Visit | Attending: Obstetrics and Gynecology | Admitting: Obstetrics and Gynecology

## 2013-11-28 ENCOUNTER — Encounter (HOSPITAL_COMMUNITY): Payer: Self-pay | Admitting: *Deleted

## 2013-11-28 DIAGNOSIS — O4700 False labor before 37 completed weeks of gestation, unspecified trimester: Secondary | ICD-10-CM | POA: Insufficient documentation

## 2013-11-28 DIAGNOSIS — O2390 Unspecified genitourinary tract infection in pregnancy, unspecified trimester: Secondary | ICD-10-CM | POA: Insufficient documentation

## 2013-11-28 DIAGNOSIS — O0930 Supervision of pregnancy with insufficient antenatal care, unspecified trimester: Secondary | ICD-10-CM

## 2013-11-28 DIAGNOSIS — O99330 Smoking (tobacco) complicating pregnancy, unspecified trimester: Secondary | ICD-10-CM | POA: Insufficient documentation

## 2013-11-28 DIAGNOSIS — A499 Bacterial infection, unspecified: Secondary | ICD-10-CM | POA: Insufficient documentation

## 2013-11-28 DIAGNOSIS — B9689 Other specified bacterial agents as the cause of diseases classified elsewhere: Secondary | ICD-10-CM | POA: Insufficient documentation

## 2013-11-28 DIAGNOSIS — O479 False labor, unspecified: Secondary | ICD-10-CM

## 2013-11-28 DIAGNOSIS — R109 Unspecified abdominal pain: Secondary | ICD-10-CM | POA: Insufficient documentation

## 2013-11-28 DIAGNOSIS — R079 Chest pain, unspecified: Secondary | ICD-10-CM | POA: Insufficient documentation

## 2013-11-28 DIAGNOSIS — N760 Acute vaginitis: Secondary | ICD-10-CM | POA: Insufficient documentation

## 2013-11-28 NOTE — MAU Note (Signed)
Pt had her last depot inj in September, had bleeding in December.  + UPT at home.  Pt having lower abd pain and upper chest pain.  Pt has hx of reflux.  Denies bleeding.

## 2013-11-29 ENCOUNTER — Inpatient Hospital Stay (HOSPITAL_COMMUNITY): Payer: Medicaid Other

## 2013-11-29 ENCOUNTER — Encounter (HOSPITAL_COMMUNITY): Payer: Self-pay | Admitting: Family

## 2013-11-29 DIAGNOSIS — N760 Acute vaginitis: Secondary | ICD-10-CM

## 2013-11-29 DIAGNOSIS — O4700 False labor before 37 completed weeks of gestation, unspecified trimester: Secondary | ICD-10-CM

## 2013-11-29 DIAGNOSIS — O2390 Unspecified genitourinary tract infection in pregnancy, unspecified trimester: Secondary | ICD-10-CM

## 2013-11-29 DIAGNOSIS — B9689 Other specified bacterial agents as the cause of diseases classified elsewhere: Secondary | ICD-10-CM

## 2013-11-29 DIAGNOSIS — A499 Bacterial infection, unspecified: Secondary | ICD-10-CM

## 2013-11-29 LAB — GC/CHLAMYDIA PROBE AMP
CT Probe RNA: NEGATIVE
GC PROBE AMP APTIMA: NEGATIVE

## 2013-11-29 LAB — WET PREP, GENITAL
TRICH WET PREP: NONE SEEN
YEAST WET PREP: NONE SEEN

## 2013-11-29 LAB — OB RESULTS CONSOLE GBS: STREP GROUP B AG: NEGATIVE

## 2013-11-29 LAB — FETAL FIBRONECTIN: FETAL FIBRONECTIN: POSITIVE — AB

## 2013-11-29 MED ORDER — METRONIDAZOLE 500 MG PO TABS: 500.0000 mg | ORAL_TABLET | Freq: Two times a day (BID) | ORAL | Status: DC

## 2013-11-29 MED ORDER — BETAMETHASONE SOD PHOS & ACET 6 (3-3) MG/ML IJ SUSP
12.0000 mg | Freq: Once | INTRAMUSCULAR | Status: AC
  Administered 2013-11-29: 12 mg via INTRAMUSCULAR
  Filled 2013-11-29: qty 2

## 2013-11-29 NOTE — MAU Provider Note (Signed)
History     CSN: [id number]  Arrival date and time: 11/28/13 2255   First Provider Initiated Contact with Patient 11/29/13 0008      Chief Complaint  Patient presents with  . Possible Pregnancy  . Chest Pain  . Abdominal Pain   Possible Pregnancy Associated symptoms include abdominal pain and chest pain. Pertinent negatives include no nausea or vomiting.  Chest Pain  Associated symptoms include abdominal pain. Pertinent negatives include no nausea or vomiting.  Abdominal Pain Associated symptoms include frequency. Pertinent negatives include no dysuria, nausea or vomiting.    Pt is a 28 yo G4P2103 at 34-[redacted] wks gestation by fundal height.  Pt reports finding out she was pregnant approximately 3 weeks ago.  Received last depo injection in April 2014 at [facility].  Here tonight for lower abdominal pain that radiates to upper abdominal.  No report of vaginal bleeding or abnormal vaginal fluid.    Past Medical History  Diagnosis Date  . Medical history non-contributory   . Preterm labor     Past Surgical History  Procedure Laterality Date  . No past surgeries      No family history on file.  History  Substance Use Topics  . Smoking status: Light Tobacco Smoker  . Smokeless tobacco: Not on file  . Alcohol Use: No    Allergies: No Known Allergies  Prescriptions prior to admission  Medication Sig Dispense Refill  . acetaminophen (TYLENOL) 500 MG tablet Take 1,000 mg by mouth every 6 (six) hours as needed. For pain      . hydrocortisone cream 1 % Apply 1 application topically as needed. For eczema on face      . medroxyPROGESTERone (DEPO-PROVERA) 150 MG/ML injection Inject 150 mg into the muscle every 3 (three) months. Last injection February 13th, 2013        Review of Systems  Cardiovascular: Positive for chest pain.  Gastrointestinal: Positive for abdominal pain. Negative for nausea and vomiting.  Genitourinary: Positive for frequency. Negative for dysuria,  urgency and flank pain.  All other systems reviewed and are negative.  Physical Exam   Blood pressure 120/62, pulse 84, temperature 98.2 F (36.8 C), temperature source Oral, resp. rate 16, height 5' (1.524 m), weight 65.953 kg (145 lb 6.4 oz).  Physical Exam  Constitutional: She is oriented to person, place, and time. She appears well-developed and well-nourished. No distress.  HENT:  Head: Normocephalic.  Neck: Normal range of motion. Neck supple.  Cardiovascular: Normal rate, regular rhythm and normal heart sounds.   Respiratory: Effort normal and breath sounds normal.  GI: Soft. There is no tenderness.  Fundal height 34  Genitourinary: No bleeding around the vagina. Vaginal discharge (mucusy) found.  Musculoskeletal: Normal range of motion. She exhibits no edema.  Neurological: She is alert and oriented to person, place, and time.  Skin: Skin is warm and dry.   FHR 120's, +accels, one decel to 90's for approx 1.5 min, otherwise normal. Toco 3-6 min  Cervix remains unchanged from 1 cm MAU Course  Procedures  Results for orders placed during the hospital encounter of 11/28/13 (from the past 24 hour(s))  WET PREP, GENITAL     Status: Abnormal   Collection Time    11/29/13 12:35 AM      Result Value Ref Range   Yeast Wet Prep HPF POC NONE SEEN  NONE SEEN   Trich, Wet Prep NONE SEEN  NONE SEEN   Clue Cells Wet Prep HPF POC FEW (*)  NONE SEEN   WBC, Wet Prep HPF POC MODERATE (*) NONE SEEN  FETAL FIBRONECTIN     Status: Abnormal   Collection Time    11/29/13 12:35 AM      Result Value Ref Range   Fetal Fibronectin POSITIVE (*) NEGATIVE   Ultrasound: (preliminary report under media tab) Vertex; AFI 16.02; normal anatomy, EGA 30.5.  0345 Consulted with Dr. Jackelyn KnifeMeisinger > reviewed HPI/OB history/no prenatal care/labs/ultrasound results/cervical exam (no change from 1 cm)/fetal monitoring (reactive NST with one decel to 90's approx 1.5 min, no other extended decels)/+FFN > since  no cervical change and once decel reassured to discharge home and return to MAU tomorrow for 2nd BMZ and call office for prenatal appointment ASAP.   Assessment and Plan  28 yo G4P2103 at 433w5d wks IUP No Prenatal Care Preterm Contractions Bacterial Vaginosis  Plan: BMZ 12 mg in MAU, return to MAU tomorrow for 2nd dose RX Flagyl 500 mg BID x 7 days Preterm Labor Precautions Call office tomorrow for appointment ASAP  Melissa NoonWalidah N Muhammad 11/29/2013, 12:13 AM

## 2013-11-29 NOTE — Progress Notes (Signed)
FHT from earlier this am reviewed.  Reactive NST, one spontaneous decel that resolved, irreg ctx.

## 2013-11-29 NOTE — Discharge Instructions (Signed)
Bacterial Vaginosis Bacterial vaginosis is an infection of the vagina. It happens when too many of certain germs (bacteria) grow in the vagina. HOME CARE  Take your medicine as told by your doctor.  Finish your medicine even if you start to feel better.  Do not have sex until you finish your medicine and are better.  Tell your sex partner that you have an infection. They should see their doctor for treatment.  Practice safe sex. Use condoms. Have only one sex partner. GET HELP IF:  You are not getting better after 3 days of treatment.  You have more grey fluid (discharge) coming from your vagina than before.  You have more pain than before.  You have a fever. MAKE SURE YOU:   Understand these instructions.  Will watch your condition.  Will get help right away if you are not doing well or get worse. Document Released: 05/07/2008 Document Revised: 05/19/2013 Document Reviewed: 03/10/2013 Exit[employer] Patient Information 2014 [employer].  

## 2013-11-30 ENCOUNTER — Inpatient Hospital Stay (HOSPITAL_COMMUNITY)
Admission: AD | Admit: 2013-11-30 | Discharge: 2013-11-30 | Disposition: A | Discharge status: Home / Self Care | Payer: Medicaid Other | Source: Ambulatory Visit | Attending: Obstetrics & Gynecology | Admitting: Obstetrics & Gynecology

## 2013-11-30 DIAGNOSIS — O4700 False labor before 37 completed weeks of gestation, unspecified trimester: Secondary | ICD-10-CM | POA: Insufficient documentation

## 2013-11-30 MED ORDER — BETAMETHASONE SOD PHOS & ACET 6 (3-3) MG/ML IJ SUSP
12.0000 mg | Freq: Once | INTRAMUSCULAR | Status: AC
  Administered 2013-11-30: 12 mg via INTRAMUSCULAR
  Filled 2013-11-30: qty 2

## 2013-11-30 NOTE — MAU Note (Signed)
Here for betamethasone injection. Denies contractions, vaginal bleeding and leaking of fluid. Report good fetal movement.

## 2013-12-01 LAB — CULTURE, BETA STREP (GROUP B ONLY): Special Requests: NORMAL

## 2013-12-06 LAB — OB RESULTS CONSOLE RUBELLA ANTIBODY, IGM: RUBELLA: IMMUNE

## 2013-12-06 LAB — OB RESULTS CONSOLE HIV ANTIBODY (ROUTINE TESTING): HIV: NONREACTIVE

## 2013-12-06 LAB — OB RESULTS CONSOLE HEPATITIS B SURFACE ANTIGEN: Hepatitis B Surface Ag: NEGATIVE

## 2013-12-06 LAB — OB RESULTS CONSOLE ANTIBODY SCREEN: Antibody Screen: NEGATIVE

## 2013-12-06 LAB — OB RESULTS CONSOLE RPR: RPR: NONREACTIVE

## 2013-12-06 LAB — OB RESULTS CONSOLE ABO/RH: RH TYPE: POSITIVE

## 2013-12-06 LAB — OB RESULTS CONSOLE GC/CHLAMYDIA
Chlamydia: NEGATIVE
Gonorrhea: NEGATIVE

## 2014-01-09 ENCOUNTER — Encounter (HOSPITAL_COMMUNITY): Payer: Self-pay

## 2014-01-09 ENCOUNTER — Inpatient Hospital Stay (HOSPITAL_COMMUNITY)
Admission: AD | Admit: 2014-01-09 | Discharge: 2014-01-11 | DRG: 774 | Disposition: A | Discharge status: Home / Self Care | Payer: Medicaid Other | Source: Ambulatory Visit | Attending: Obstetrics and Gynecology | Admitting: Obstetrics and Gynecology

## 2014-01-09 DIAGNOSIS — O1002 Pre-existing essential hypertension complicating childbirth: Principal | ICD-10-CM | POA: Diagnosis present

## 2014-01-09 DIAGNOSIS — O99334 Smoking (tobacco) complicating childbirth: Secondary | ICD-10-CM | POA: Diagnosis present

## 2014-01-09 DIAGNOSIS — O0930 Supervision of pregnancy with insufficient antenatal care, unspecified trimester: Secondary | ICD-10-CM

## 2014-01-09 HISTORY — DX: Anemia, unspecified: D64.9

## 2014-01-09 LAB — CBC
HCT: 31.3 % — ABNORMAL LOW (ref 36.0–46.0)
Hemoglobin: 10.5 g/dL — ABNORMAL LOW (ref 12.0–15.0)
MCH: 31.5 pg (ref 26.0–34.0)
MCHC: 33.5 g/dL (ref 30.0–36.0)
MCV: 94 fL (ref 78.0–100.0)
Platelets: 265 10*3/uL (ref 150–400)
RBC: 3.33 MIL/uL — ABNORMAL LOW (ref 3.87–5.11)
RDW: 13.3 % (ref 11.5–15.5)
WBC: 14.4 10*3/uL — ABNORMAL HIGH (ref 4.0–10.5)

## 2014-01-09 LAB — SYPHILIS: RPR W/REFLEX TO RPR TITER AND TREPONEMAL ANTIBODIES, TRADITIONAL SCREENING AND DIAGNOSIS ALGORITHM

## 2014-01-09 MED ORDER — ONDANSETRON HCL 4 MG/2ML IJ SOLN: 4.0000 mg | INTRAMUSCULAR | Status: DC | PRN

## 2014-01-09 MED ORDER — IBUPROFEN 600 MG PO TABS
600.0000 mg | ORAL_TABLET | Freq: Four times a day (QID) | ORAL | Status: DC
  Administered 2014-01-09 – 2014-01-11 (×8): 600 mg via ORAL
  Filled 2014-01-09 (×8): qty 1

## 2014-01-09 MED ORDER — OXYTOCIN BOLUS FROM INFUSION
500.0000 mL | INTRAVENOUS | Status: DC
Start: 2014-01-09 — End: 2014-01-09
  Administered 2014-01-09: 500 mL via INTRAVENOUS

## 2014-01-09 MED ORDER — DIPHENHYDRAMINE HCL 25 MG PO CAPS: 25.0000 mg | ORAL_CAPSULE | Freq: Four times a day (QID) | ORAL | Status: DC | PRN

## 2014-01-09 MED ORDER — OXYCODONE-ACETAMINOPHEN 5-325 MG PO TABS: 1.0000 | ORAL_TABLET | ORAL | Status: DC | PRN

## 2014-01-09 MED ORDER — DIBUCAINE 1 % RE OINT: 1.0000 "application " | TOPICAL_OINTMENT | RECTAL | Status: DC | PRN

## 2014-01-09 MED ORDER — OXYCODONE-ACETAMINOPHEN 5-325 MG PO TABS
1.0000 | ORAL_TABLET | ORAL | Status: DC | PRN
  Administered 2014-01-09 – 2014-01-10 (×4): 1 via ORAL
  Administered 2014-01-10: 2 via ORAL
  Administered 2014-01-10 – 2014-01-11 (×2): 1 via ORAL
  Filled 2014-01-09: qty 2
  Filled 2014-01-09 (×6): qty 1

## 2014-01-09 MED ORDER — ONDANSETRON HCL 4 MG/2ML IJ SOLN: 4.0000 mg | Freq: Four times a day (QID) | INTRAMUSCULAR | Status: DC | PRN

## 2014-01-09 MED ORDER — IBUPROFEN 600 MG PO TABS
600.0000 mg | ORAL_TABLET | Freq: Four times a day (QID) | ORAL | Status: DC | PRN
  Administered 2014-01-09: 600 mg via ORAL
  Filled 2014-01-09: qty 1

## 2014-01-09 MED ORDER — FLEET ENEMA 7-19 GM/118ML RE ENEM: 1.0000 | ENEMA | RECTAL | Status: DC | PRN

## 2014-01-09 MED ORDER — OXYTOCIN 40 UNITS IN LACTATED RINGERS INFUSION - SIMPLE MED
62.5000 mL/h | INTRAVENOUS | Status: DC
  Filled 2014-01-09: qty 1000

## 2014-01-09 MED ORDER — PRENATAL MULTIVITAMIN CH
1.0000 | ORAL_TABLET | Freq: Every day | ORAL | Status: DC
  Administered 2014-01-11: 1 via ORAL
  Filled 2014-01-09 (×2): qty 1

## 2014-01-09 MED ORDER — LACTATED RINGERS IV SOLN
INTRAVENOUS | Status: DC
  Administered 2014-01-09: 07:00:00 via INTRAVENOUS

## 2014-01-09 MED ORDER — CITRIC ACID-SODIUM CITRATE 334-500 MG/5ML PO SOLN: 30.0000 mL | ORAL | Status: DC | PRN

## 2014-01-09 MED ORDER — BUTORPHANOL TARTRATE 1 MG/ML IJ SOLN
1.0000 mg | INTRAMUSCULAR | Status: DC | PRN
  Administered 2014-01-09 (×2): 1 mg via INTRAVENOUS
  Filled 2014-01-09 (×2): qty 1

## 2014-01-09 MED ORDER — BENZOCAINE-MENTHOL 20-0.5 % EX AERO: 1.0000 "application " | INHALATION_SPRAY | CUTANEOUS | Status: DC | PRN

## 2014-01-09 MED ORDER — LANOLIN HYDROUS EX OINT: TOPICAL_OINTMENT | CUTANEOUS | Status: DC | PRN

## 2014-01-09 MED ORDER — LIDOCAINE HCL (PF) 1 % IJ SOLN
30.0000 mL | INTRAMUSCULAR | Status: DC | PRN
  Filled 2014-01-09: qty 30

## 2014-01-09 MED ORDER — ZOLPIDEM TARTRATE 5 MG PO TABS: 5.0000 mg | ORAL_TABLET | Freq: Every evening | ORAL | Status: DC | PRN

## 2014-01-09 MED ORDER — SIMETHICONE 80 MG PO CHEW: 80.0000 mg | CHEWABLE_TABLET | ORAL | Status: DC | PRN

## 2014-01-09 MED ORDER — SENNOSIDES-DOCUSATE SODIUM 8.6-50 MG PO TABS
2.0000 | ORAL_TABLET | ORAL | Status: DC
  Administered 2014-01-10 (×2): 2 via ORAL
  Filled 2014-01-09 (×2): qty 2

## 2014-01-09 MED ORDER — TETANUS-DIPHTH-ACELL PERTUSSIS 5-2.5-18.5 LF-MCG/0.5 IM SUSP
0.5000 mL | Freq: Once | INTRAMUSCULAR | Status: AC
  Administered 2014-01-10: 0.5 mL via INTRAMUSCULAR
  Filled 2014-01-09: qty 0.5

## 2014-01-09 MED ORDER — WITCH HAZEL-GLYCERIN EX PADS: 1.0000 "application " | MEDICATED_PAD | CUTANEOUS | Status: DC | PRN

## 2014-01-09 MED ORDER — ACETAMINOPHEN 325 MG PO TABS: 650.0000 mg | ORAL_TABLET | ORAL | Status: DC | PRN

## 2014-01-09 MED ORDER — ONDANSETRON HCL 4 MG PO TABS: 4.0000 mg | ORAL_TABLET | ORAL | Status: DC | PRN

## 2014-01-09 MED ORDER — LACTATED RINGERS IV SOLN: 500.0000 mL | INTRAVENOUS | Status: DC | PRN

## 2014-01-09 NOTE — Progress Notes (Signed)
Clinical Social Work Department PSYCHOSOCIAL ASSESSMENT - MATERNAL/CHILD 01/09/2014  Patient:  [name],[name]  Account Number:  [id number]  Admit Date:  01/09/2014  Childs Name:   Sianna [name]    Clinical Social Worker:  Mischelle Reeg, LCSW   Date/Time:  01/09/2014 01:30 PM  Date Referred:  01/09/2014   Referral source  Central Nursery     Referred reason  LPNC   Other referral source:    I:  FAMILY / HOME ENVIRONMENT Child's legal guardian:  PARENT  Guardian - Name Guardian - Age Guardian - Address  [name],[name] [address]  [place], [place] [address]  [name], [name]  same as above   Other household support members/support persons Other support:  Reports extensive family support  II  PSYCHOSOCIAL DATA Information Source:    Financial and Community Resources Employment:   Both parents employed   Financial resources:  Medicaid If Medicaid - County:   Other  Food Stamps Mother plans to apply for  WIC   School / Grade:   Maternity Care Coordinator / Child Services Coordination / Early Interventions:  Cultural issues impacting care:    III  STRENGTHS Strengths  Supportive family/friends  Home prepared for Child (including basic supplies)  Adequate Resources   Strength comment:    IV  RISK FACTORS AND CURRENT PROBLEMS Current Problem:       V  SOCIAL WORK ASSESSMENT Met with mother who was pleasant and receptive to social work intervention.  She and newborn's father cohabitate. She has three one other dependents ages 13,4, and 3.  Both parents are employed.  Mother states that she had limited PNC because initial pregnancy test in December was negative and it was not until two months later that she became aware of the pregnancy.    She denies hx of mental illness or substance abuse.  UDS and Meconium drug screen are pending. She plans to use [facility] for well-baby care.  No acute social concerns noted or reported at this time.      VI SOCIAL WORK  PLAN  Type of pt/family education:   If child protective services report - county:   If child protective services report - date:   Information/referral to community resources comment:   Other social work plan:   will continue to monitor drug screens     

## 2014-01-09 NOTE — H&P (Signed)
[name] is a 28 y.o. female [phone] at 36+weeks (EDD 02/02/14 by unknown LMP and 30 week [place])  presenting for regular strong contractions and cervical change to 5cm.  Prenatal care has been extremely limited.  Pt has been seen for 3 office visits only. She was seen at [facility] 4/20 with PTL and had a dating [place] then.  Her FFN was positive so she was given betamethasone.  GBS at that visit was negative.  Her other labs were unremarkable but I cannot see that she ever did a one hour GTT. Her last pregnancy she had no PNC.  She signed papers for a tubal ligation 12/13/13, but it has not yet been 30 days.  Maternal Medical History:  Reason for admission: Contractions.   Contractions: Onset was 3-5 hours ago.   Frequency: regular.   Perceived severity is strong.    Fetal activity: Perceived fetal activity is normal.    Prenatal complications: Preterm labor.     OB History   Grav Para Term Preterm Abortions TAB SAB Ect Mult Living   4 3 2 1      3     2001 NSVD 6#11oz 2010 NSVD 5#1oz 2012 NSVD 5#5oz SAB x 1  Past Medical History  Diagnosis Date  . Medical history non-contributory   . Preterm labor   . Anemia   . Hypertension    Past Surgical History  Procedure Laterality Date  . No past surgeries     Family History: family history is not on file. Social History:  reports that she has been smoking.  She has never used smokeless tobacco. She reports that she does not drink alcohol or use illicit drugs.   Prenatal Transfer Tool  Maternal Diabetes: unknown, no history Genetic Screening:too late to care Maternal Ultrasounds/Referrals: Normal at 30 weeks Fetal Ultrasounds or other Referrals:  None Maternal Substance Abuse:  None known Significant Maternal Medications:  None Significant Maternal Lab Results:  None Other Comments:  None  ROS  Dilation: 7 Effacement (%): 80 Station: -1 Exam by:: Tiearra Colwell Blood pressure 124/61, pulse 72, temperature 97.8 F (36.6  C), temperature source Oral, resp. rate 20, height 5' (1.524 m), weight 65.772 kg (145 lb). Maternal Exam:  Uterine Assessment: Contraction strength is firm.  Contraction frequency is regular.   Abdomen: Patient reports no abdominal tenderness. Fetal presentation: vertex  Introitus: Normal vulva. Normal vagina.    Physical Exam  Constitutional: She is oriented to person, place, and time. She appears well-developed and well-nourished.  Cardiovascular: Normal rate.   Respiratory: Effort normal.  GI: Soft.  Genitourinary: Vagina normal.  Uterus gravid  Neurological: She is alert and oriented to person, place, and time.  Psychiatric: She has a normal mood and affect.    Prenatal labs: ABO, Rh: O/Positive/-- (04/27 0000) Antibody: Negative (04/27 0000) Rubella: Immune (04/27 0000) RPR: Nonreactive (04/27 0000)  HBsAg: Negative (04/27 0000)  HIV: Non-reactive (04/27 0000)  GBS: Negative (04/20 0000)  CF negative in past pregnancy  Assessment/Plan: Pt presented by EMS in active labor.  Her prenatal care has been very scanty.  She has not been seen in our office since 12/21/13 and only had 3 appointments total.  She has been admitted and given stadol.  Declines epidural for now.  Received betamethasone 4/20 and 4/21 for PTL.  She did not sign BTL papers until 12/13/13 so will have to have that performed later if she still desires.  Most prenatals done, but cannot find a result for her  DM screen.  Await vaginal delivery.  Oliver PilaKathy W Deja Kaigler 01/09/2014, 7:53 AM

## 2014-01-09 NOTE — MAU Note (Signed)
Came by EMS.  Contractions. [facility] Discharge. Baby not moving as often today.  1 cm in office 2 weeks ago.

## 2014-01-10 LAB — CBC
HCT: 27.3 % — ABNORMAL LOW (ref 36.0–46.0)
Hemoglobin: 9 g/dL — ABNORMAL LOW (ref 12.0–15.0)
MCH: 31.1 pg (ref 26.0–34.0)
MCHC: 33 g/dL (ref 30.0–36.0)
MCV: 94.5 fL (ref 78.0–100.0)
PLATELETS: 251 10*3/uL (ref 150–400)
RBC: 2.89 MIL/uL — ABNORMAL LOW (ref 3.87–5.11)
RDW: 13.4 % (ref 11.5–15.5)
WBC: 20.4 10*3/uL — AB (ref 4.0–10.5)

## 2014-01-10 NOTE — Progress Notes (Signed)
Ur chart review completed.  

## 2014-01-10 NOTE — Progress Notes (Signed)
Post Partum Day 1 Subjective: up ad lib and tolerating PO  Objective: Blood pressure 133/72, pulse 64, temperature 98.1 F (36.7 C), temperature source Oral, resp. rate 18, height 5' (1.524 m), weight 65.772 kg (145 lb), SpO2 99.00%, unknown if currently breastfeeding.  Physical Exam:  General: alert and cooperative Lochia: appropriate Uterine Fundus: firm    Recent Labs  01/09/14 0720 01/10/14 0550  HGB 10.5* 9.0*  HCT 31.3* 27.3*    Assessment/Plan: Plan for discharge tomorrow   LOS: 1 day   Oliver Pila 01/10/2014, 8:55 AM

## 2014-01-11 MED ORDER — OXYCODONE-ACETAMINOPHEN 5-325 MG PO TABS: 1.0000 | ORAL_TABLET | ORAL | Status: DC | PRN

## 2014-01-11 MED ORDER — IBUPROFEN 600 MG PO TABS: 600.0000 mg | ORAL_TABLET | Freq: Four times a day (QID) | ORAL | Status: DC

## 2014-01-11 NOTE — Progress Notes (Signed)
Post Partum Day 2 Subjective: no complaints and up ad lib  Objective: Blood pressure 107/62, pulse 59, temperature 97.8 F (36.6 C), temperature source Oral, resp. rate 18, height 5' (1.524 m), weight 65.772 kg (145 lb), SpO2 99.00%, unknown if currently breastfeeding.  Physical Exam:  General: alert and cooperative Lochia: appropriate Uterine Fundus: firm    Recent Labs  01/09/14 0720 01/10/14 0550  HGB 10.5* 9.0*  HCT 31.3* 27.3*    Assessment/Plan: Discharge home D/w pt her options re: interval tubal and Essure in detail.  Wants Essure   LOS: 2 days   Oliver Pila 01/11/2014, 7:44 AM

## 2014-01-11 NOTE — Discharge Summary (Signed)
Obstetric Discharge Summary Reason for Admission: onset of labor Prenatal Procedures: ultrasound Intrapartum Procedures: spontaneous vaginal delivery Postpartum Procedures: none Complications-Operative and Postpartum: none Hemoglobin  Date Value Ref Range Status  01/10/2014 9.0* 12.0 - 15.0 g/dL Final     HCT  Date Value Ref Range Status  01/10/2014 27.3* 36.0 - 46.0 % Final    Physical Exam:  General: alert and cooperative Lochia: appropriate Uterine Fundus: firm 2  Discharge Diagnoses: Preterm delivery at 36+weeks                                          Scant prenatal care  Discharge Information: Date: 01/11/2014 Activity: pelvic rest Diet: routine Medications: Ibuprofen and Percocet Condition: improved Instructions: refer to practice specific booklet Discharge to: home Follow-up Information   Follow up with Oliver Pila, MD. Schedule an appointment as soon as possible for a visit in 6 weeks. (postpartum)    Specialty:  Obstetrics and Gynecology   Contact information:   [address] [place] [address] [phone]       Newborn Data: Live born female  Birth Weight: 5 lb 1.3 oz (2305 g) APGAR: 8, 9  Home with mother.  Oliver Pila 01/11/2014, 9:38 AM

## 2014-02-23 ENCOUNTER — Other Ambulatory Visit (HOSPITAL_COMMUNITY): Payer: Self-pay | Admitting: Family

## 2014-06-13 ENCOUNTER — Encounter (HOSPITAL_COMMUNITY): Payer: Self-pay

## 2014-11-13 ENCOUNTER — Emergency Department (HOSPITAL_COMMUNITY)
Admission: EM | Admit: 2014-11-13 | Discharge: 2014-11-13 | Disposition: A | Discharge status: Home / Self Care | Payer: Medicaid Other | Attending: Emergency Medicine | Admitting: Emergency Medicine

## 2014-11-13 ENCOUNTER — Encounter (HOSPITAL_COMMUNITY): Payer: Self-pay | Admitting: Emergency Medicine

## 2014-11-13 DIAGNOSIS — D649 Anemia, unspecified: Secondary | ICD-10-CM | POA: Diagnosis not present

## 2014-11-13 DIAGNOSIS — R109 Unspecified abdominal pain: Secondary | ICD-10-CM | POA: Diagnosis present

## 2014-11-13 DIAGNOSIS — R112 Nausea with vomiting, unspecified: Secondary | ICD-10-CM | POA: Insufficient documentation

## 2014-11-13 DIAGNOSIS — R1084 Generalized abdominal pain: Secondary | ICD-10-CM | POA: Diagnosis not present

## 2014-11-13 DIAGNOSIS — Z3202 Encounter for pregnancy test, result negative: Secondary | ICD-10-CM | POA: Insufficient documentation

## 2014-11-13 DIAGNOSIS — Z720 Tobacco use: Secondary | ICD-10-CM | POA: Diagnosis not present

## 2014-11-13 DIAGNOSIS — Z79899 Other long term (current) drug therapy: Secondary | ICD-10-CM | POA: Insufficient documentation

## 2014-11-13 DIAGNOSIS — I10 Essential (primary) hypertension: Secondary | ICD-10-CM | POA: Diagnosis not present

## 2014-11-13 DIAGNOSIS — K219 Gastro-esophageal reflux disease without esophagitis: Secondary | ICD-10-CM | POA: Diagnosis not present

## 2014-11-13 HISTORY — DX: Gastro-esophageal reflux disease without esophagitis: K21.9

## 2014-11-13 LAB — COMPREHENSIVE METABOLIC PANEL
ALBUMIN: 3.3 g/dL — AB (ref 3.5–5.2)
ALK PHOS: 40 U/L (ref 39–117)
ALT: 16 U/L (ref 0–35)
ALT: 14 U/L (ref 0–35)
ANION GAP: 9 (ref 5–15)
AST: 44 U/L — ABNORMAL HIGH (ref 0–37)
AST: 15 U/L (ref 0–37)
Albumin: 3.7 g/dL (ref 3.5–5.2)
Alkaline Phosphatase: 39 U/L (ref 39–117)
Anion gap: 4 — ABNORMAL LOW (ref 5–15)
BUN: 13 mg/dL (ref 6–23)
BUN: 9 mg/dL (ref 6–23)
CALCIUM: 8.8 mg/dL (ref 8.4–10.5)
CO2: 21 mmol/L (ref 19–32)
CO2: 23 mmol/L (ref 19–32)
CREATININE: 0.84 mg/dL (ref 0.50–1.10)
CREATININE: 0.68 mg/dL (ref 0.50–1.10)
Calcium: 8.1 mg/dL — ABNORMAL LOW (ref 8.4–10.5)
Chloride: 107 mmol/L (ref 96–112)
Chloride: 112 mmol/L (ref 96–112)
GFR calc Af Amer: >90 mL/min (ref >90)
GFR calc non Af Amer: >90 mL/min (ref >90)
GFR calc non Af Amer: >90 mL/min (ref >90)
Glucose, Bld: 161 mg/dL — ABNORMAL HIGH (ref 70–99)
Glucose, Bld: 87 mg/dL (ref 70–99)
Potassium: 5.6 mmol/L — ABNORMAL HIGH (ref 3.5–5.1)
Potassium: 3.6 mmol/L (ref 3.5–5.1)
SODIUM: 137 mmol/L (ref 135–145)
Sodium: 139 mmol/L (ref 135–145)
Total Bilirubin: 1.7 mg/dL — ABNORMAL HIGH (ref 0.3–1.2)
Total Bilirubin: 0.4 mg/dL (ref 0.3–1.2)
Total Protein: 6 g/dL (ref 6.0–8.3)
Total Protein: 5.8 g/dL — ABNORMAL LOW (ref 6.0–8.3)

## 2014-11-13 LAB — URINALYSIS, ROUTINE W REFLEX MICROSCOPIC
Bilirubin Urine: NEGATIVE
GLUCOSE, UA: NEGATIVE mg/dL
Hgb urine dipstick: NEGATIVE
KETONES UR: NEGATIVE mg/dL
Nitrite: NEGATIVE
PH: 6 (ref 5.0–8.0)
Protein, ur: NEGATIVE mg/dL
SPECIFIC GRAVITY, URINE: 1.018 (ref 1.005–1.030)
Urobilinogen, UA: 0.2 mg/dL (ref 0.0–1.0)

## 2014-11-13 LAB — CBC WITH DIFFERENTIAL/PLATELET
BASOS ABS: 0.1 10*3/uL (ref 0.0–0.1)
BASOS PCT: 1 % (ref 0–1)
Eosinophils Absolute: 0.4 10*3/uL (ref 0.0–0.7)
Eosinophils Relative: 3 % (ref 0–5)
HEMATOCRIT: 38.3 % (ref 36.0–46.0)
HEMOGLOBIN: 12.9 g/dL (ref 12.0–15.0)
LYMPHS ABS: 5.8 10*3/uL — AB (ref 0.7–4.0)
LYMPHS PCT: 50 % — AB (ref 12–46)
MCH: 31.2 pg (ref 26.0–34.0)
MCHC: 33.7 g/dL (ref 30.0–36.0)
MCV: 92.7 fL (ref 78.0–100.0)
MONOS PCT: 6 % (ref 3–12)
Monocytes Absolute: 0.7 10*3/uL (ref 0.1–1.0)
NEUTROS PCT: 40 % — AB (ref 43–77)
Neutro Abs: 4.7 10*3/uL (ref 1.7–7.7)
PLATELETS: 179 10*3/uL (ref 150–400)
RBC: 4.13 MIL/uL (ref 3.87–5.11)
RDW: 12.5 % (ref 11.5–15.5)
WBC: 11.7 10*3/uL — ABNORMAL HIGH (ref 4.0–10.5)

## 2014-11-13 LAB — URINE MICROSCOPIC-ADD ON

## 2014-11-13 LAB — LIPASE, BLOOD: Lipase: 24 U/L (ref 11–59)

## 2014-11-13 LAB — POC URINE PREG, ED: Preg Test, Ur: NEGATIVE

## 2014-11-13 MED ORDER — ONDANSETRON HCL 4 MG/2ML IJ SOLN
4.0000 mg | Freq: Once | INTRAMUSCULAR | Status: AC
  Administered 2014-11-13: 4 mg via INTRAVENOUS
  Filled 2014-11-13: qty 2

## 2014-11-13 MED ORDER — SODIUM CHLORIDE 0.9 % IV BOLUS (SEPSIS)
1000.0000 mL | Freq: Once | INTRAVENOUS | Status: AC
  Administered 2014-11-13: 1000 mL via INTRAVENOUS

## 2014-11-13 MED ORDER — MORPHINE SULFATE 4 MG/ML IJ SOLN
4.0000 mg | Freq: Once | INTRAMUSCULAR | Status: AC
  Administered 2014-11-13: 4 mg via INTRAVENOUS
  Filled 2014-11-13: qty 1

## 2014-11-13 MED ORDER — HYDROCODONE-ACETAMINOPHEN 5-325 MG PO TABS: 1.0000 | ORAL_TABLET | Freq: Four times a day (QID) | ORAL | Status: DC | PRN

## 2014-11-13 MED ORDER — ONDANSETRON HCL 4 MG PO TABS: 4.0000 mg | ORAL_TABLET | Freq: Four times a day (QID) | ORAL | Status: DC

## 2014-11-13 NOTE — ED Notes (Signed)
Patient is resting comfortably. 

## 2014-11-13 NOTE — ED Notes (Signed)
Pt. reports central abdominal pain onset this morning with emesis( x1) , denies diarrhea or dysuria . No fever or chills.

## 2014-11-13 NOTE — Discharge Instructions (Signed)
Only use your pain medication for severe pain. Do not operate heavy machinery while on pain medication or muscle relaxer. Note that your pain medication contains acetaminophen (Tylenol) & its is not recommended that you use additional acetaminophen (Tylenol) while taking this medication.   Abdominal Pain  Your exam might not show the exact reason you have abdominal pain. Since there are many different causes of abdominal pain, another checkup and more tests may be needed. It is very important to follow up for lasting (persistent) or worsening symptoms. A possible cause of abdominal pain in any person who still has his or her appendix is acute appendicitis. Appendicitis is often hard to diagnose. Normal blood tests, urine tests, ultrasound, and CT scans do not completely rule out early appendicitis or other causes of abdominal pain. Sometimes, only the changes that happen over time will allow appendicitis and other causes of abdominal pain to be determined. Other potential problems that may require surgery may also take time to become more apparent. Because of this, it is important that you follow all of the instructions below.   HOME CARE INSTRUCTIONS  Do not take laxatives unless directed by your caregiver. Rest as much as possible.  Do not eat solid food until your pain is gone: A diet of water, weak decaffeinated tea, broth or bouillon, gelatin, oral rehydration solutions (ORS), frozen ice pops, or ice chips may be helpful.  When pain is gone: Start a light diet (dry toast, crackers, applesauce, or white rice). Increase the diet slowly as long as it does not bother you. Eat no dairy products (including cheese and eggs) and no spicy, fatty, fried, or high-fiber foods.  Use no alcohol, caffeine, or cigarettes.  Take your regular medicines unless your caregiver told you not to.  Take any prescribed medicine as directed.   SEEK IMMEDIATE MEDICAL CARE IF:  The pain does not go away.  You have a fever  >101 that persists You keep throwing up (vomiting) or cannot drink liquids.  The pain becomes localized (Pain in the right side could possibly be appendicitis. In an adult, pain in the left lower portion of the abdomen could be colitis or diverticulitis). You pass bloody or black tarry stools.  You have shaking chills.  There is blood in your vomit or you see blood in your bowel movements.  Your bowel movements stop (become blocked) or you cannot pass gas.  You have bloody, frequent, or painful urination.  You have yellow discoloration in the skin or whites of the eyes.  Your stomach becomes bloated or bigger.  You have dizziness or fainting.  You have chest or back pain.

## 2014-11-13 NOTE — ED Provider Notes (Signed)
CSN: [id number]     Arrival date & time 11/13/14  0502 History   First MD Initiated Contact with Patient 11/13/14 0600     Chief Complaint  Patient presents with  . Abdominal Pain     (Consider location/radiation/quality/duration/timing/severity/associated sxs/prior Treatment) HPI Comments: Patient presents today with a chief complaint of abdominal pain.  She reports that she began having pain approximately one hour prior to arrival.  Pain is generalized, but is worse in the periumbilical area.  Pain does not radiate.  She has not taken anything for pain prior to arrival.  Pain associated with one episode of vomiting.  She denies diarrhea or constipation.  She states that she had a BM last evening, which was normal.  She denies urinary symptoms.  She denies fever or chills.  LMP was 2 weeks ago, which she reports was normal.    Patient is a 29 y.o. female presenting with abdominal pain. The history is provided by the patient.  Abdominal Pain   Past Medical History  Diagnosis Date  . Medical history non-contributory   . Preterm labor   . Anemia   . Hypertension   . GERD (gastroesophageal reflux disease)    Past Surgical History  Procedure Laterality Date  . No past surgeries     No family history on file. History  Substance Use Topics  . Smoking status: Light Tobacco Smoker  . Smokeless tobacco: Never Used  . Alcohol Use: No   OB History    Gravida Para Term Preterm AB TAB SAB Ectopic Multiple Living   4 4 2 2      4      Review of Systems  Gastrointestinal: Positive for abdominal pain.  All other systems reviewed and are negative.     Allergies  Review of patient's allergies indicates no known allergies.  Home Medications   Prior to Admission medications   Medication Sig Start Date End Date Taking? Authorizing Provider  acetaminophen (TYLENOL) 500 MG tablet Take 500 mg by mouth every 6 (six) hours as needed for mild pain.   Yes Historical Provider, MD  omeprazole  (PRILOSEC OTC) 20 MG tablet Take 20 mg by mouth daily as needed (acid reflux).    Yes Historical Provider, MD  ibuprofen (ADVIL,MOTRIN) 600 MG tablet Take 1 tablet (600 mg total) by mouth every 6 (six) hours. Patient not taking: Reported on 11/13/2014 01/11/14   Huel CoteKathy Richardson, MD  oxyCODONE-acetaminophen (PERCOCET/ROXICET) 5-325 MG per tablet Take 1-2 tablets by mouth every 4 (four) hours as needed for severe pain (moderate - severe pain). Patient not taking: Reported on 11/13/2014 01/11/14   Huel CoteKathy Richardson, MD  Prenatal Vit-Fe Fumarate-FA (PRENATAL MULTIVITAMIN) TABS tablet Take 1 tablet by mouth daily at 12 noon.    Historical Provider, MD   BP 144/77 mmHg  Pulse 76  Temp(Src) 98.2 F (36.8 C) (Oral)  Resp 18  Ht 5\' 3"  (1.6 m)  Wt 150 lb (68.04 kg)  BMI 26.58 kg/m2  SpO2 99%  LMP 10/30/2014 Physical Exam  Constitutional: She appears well-developed and well-nourished.  HENT:  Head: Normocephalic and atraumatic.  Mouth/Throat: Oropharynx is clear and moist.  Neck: Normal range of motion. Neck supple.  Cardiovascular: Normal rate, regular rhythm and normal heart sounds.   Pulmonary/Chest: Effort normal and breath sounds normal.  Abdominal: Soft. Bowel sounds are normal. She exhibits no distension and no mass. There is generalized tenderness. There is no rebound, no guarding and negative Murphy's sign.  Musculoskeletal: Normal range of motion.  Neurological: She is alert.  Skin: Skin is warm and dry.  Psychiatric: She has a normal mood and affect.  Nursing note and vitals reviewed.   ED Course  Procedures (including critical care time) Labs Review Labs Reviewed  CBC WITH DIFFERENTIAL/PLATELET  COMPREHENSIVE METABOLIC PANEL  LIPASE, BLOOD  URINALYSIS, ROUTINE W REFLEX MICROSCOPIC  POC URINE PREG, ED    Imaging Review No results found.   EKG Interpretation None     8:12 AM Reassessed patient.  Mild generalized abdominal tenderness to palpation worse of the left side of  the abdomen.  Negative Murphy's sign.   9:00 AM Reassessed patient.  Pain and nausea controlled.  Patient tolerating PO liquids. MDM   Final diagnoses:  None   Patient presents today with generalized abdominal pain, nausea, and one episode of vomiting.  Onset of symptoms this morning.  Patient afebrile.  Labs unremarkable aside from mild leukocytosis.  Urine pregnancy negative.  UA is negative.  On exam, she does not have any localized pain.  No rebound or guarding.  Pain and nausea controlled at time of discharge.  Patient stable for discharge.  Return precautions given.    Santiago Glad, PA-C 11/14/14 2100  Layla Maw Ward, DO 11/15/14 1610

## 2014-11-13 NOTE — ED Notes (Signed)
Pt ambulated to restroom without distress.  

## 2014-11-13 NOTE — ED Notes (Signed)
Patient is resting comfortably without distress. 

## 2014-11-13 NOTE — ED Notes (Signed)
PT ambulated with baseline gait; VSS; A&Ox3; no signs of distress; respirations even and unlabored; skin warm and dry; no questions upon discharge.  

## 2014-11-14 LAB — URINE CULTURE

## 2015-08-13 NOTE — L&D Delivery Note (Signed)
Delivery Note At 8:53 AM a viable and preterm female was delivered via Vaginal, Spontaneous Delivery (Presentation:LOA ).  APGAR: 6, 9; weight 3 lb 3.5 oz (1460 g).   Placenta status: Intact, Spontaneous.  Cord: 3 vessels with the following complications: None.  Cord pH: 7.107  Anesthesia: None  Episiotomy: None Lacerations: None Suture Repair: n/a Est. Blood Loss (mL): 100  Mom to Women's Unit.  Baby to NICU.  [name] is a 30 y.o. female [phone] with IUP at 9566w0d by LMP, no prenatal care, admitted for preterm labor .  She progressed quickly without augmentation to complete and pushed 1-2 times to deliver.  NICU team present for delivery.  Infant well-appearing, larger than 25 weeks expected by LMP, with some initial cry and responsive to stimulation.  1 minute Apgar 6.  Cord clamping delayed by 1 minute then clamped by CNM and cut by FOB.  Placenta intact and spontaneous, bleeding minimal.  Intact perineum.  Infant to NICU. [facility]  She plans on bottle feeding.   LEFTWICH-KIRBY, Alexee Delsanto 11/06/2015, 11:56 AM

## 2015-11-06 ENCOUNTER — Inpatient Hospital Stay (HOSPITAL_COMMUNITY): Payer: Medicaid Other

## 2015-11-06 ENCOUNTER — Encounter (HOSPITAL_COMMUNITY): Payer: Self-pay | Admitting: *Deleted

## 2015-11-06 ENCOUNTER — Inpatient Hospital Stay (HOSPITAL_COMMUNITY)
Admission: AD | Admit: 2015-11-06 | Discharge: 2015-11-08 | DRG: 774 | Disposition: A | Discharge status: Home / Self Care | Payer: Medicaid Other | Source: Ambulatory Visit | Attending: Family Medicine | Admitting: Family Medicine

## 2015-11-06 DIAGNOSIS — Z833 Family history of diabetes mellitus: Secondary | ICD-10-CM | POA: Diagnosis not present

## 2015-11-06 DIAGNOSIS — K219 Gastro-esophageal reflux disease without esophagitis: Secondary | ICD-10-CM | POA: Diagnosis present

## 2015-11-06 DIAGNOSIS — Z87891 Personal history of nicotine dependence: Secondary | ICD-10-CM | POA: Diagnosis not present

## 2015-11-06 DIAGNOSIS — D649 Anemia, unspecified: Secondary | ICD-10-CM | POA: Diagnosis present

## 2015-11-06 DIAGNOSIS — O9962 Diseases of the digestive system complicating childbirth: Secondary | ICD-10-CM | POA: Diagnosis present

## 2015-11-06 DIAGNOSIS — O1002 Pre-existing essential hypertension complicating childbirth: Secondary | ICD-10-CM | POA: Diagnosis present

## 2015-11-06 DIAGNOSIS — O26899 Other specified pregnancy related conditions, unspecified trimester: Secondary | ICD-10-CM

## 2015-11-06 DIAGNOSIS — O4692 Antepartum hemorrhage, unspecified, second trimester: Secondary | ICD-10-CM | POA: Diagnosis present

## 2015-11-06 DIAGNOSIS — O9902 Anemia complicating childbirth: Secondary | ICD-10-CM

## 2015-11-06 DIAGNOSIS — Z3A25 25 weeks gestation of pregnancy: Secondary | ICD-10-CM

## 2015-11-06 DIAGNOSIS — R109 Unspecified abdominal pain: Secondary | ICD-10-CM

## 2015-11-06 LAB — CBC
HCT: 27.7 % — ABNORMAL LOW (ref 36.0–46.0)
HEMOGLOBIN: 9.6 g/dL — AB (ref 12.0–15.0)
MCH: 31.5 pg (ref 26.0–34.0)
MCHC: 34.7 g/dL (ref 30.0–36.0)
MCV: 90.8 fL (ref 78.0–100.0)
Platelets: 364 10*3/uL (ref 150–400)
RBC: 3.05 MIL/uL — ABNORMAL LOW (ref 3.87–5.11)
RDW: 12.7 % (ref 11.5–15.5)
WBC: 25.8 10*3/uL — ABNORMAL HIGH (ref 4.0–10.5)

## 2015-11-06 LAB — RAPID URINE DRUG SCREEN, HOSP PERFORMED
Amphetamines: NOT DETECTED
BARBITURATES: NOT DETECTED
Benzodiazepines: NOT DETECTED
COCAINE: NOT DETECTED
OPIATES: NOT DETECTED
Tetrahydrocannabinol: POSITIVE — AB

## 2015-11-06 LAB — URINALYSIS, ROUTINE W REFLEX MICROSCOPIC
GLUCOSE, UA: NEGATIVE mg/dL
Ketones, ur: >80 mg/dL — AB
Nitrite: NEGATIVE
PH: 8.5 — AB (ref 5.0–8.0)
Protein, ur: 30 mg/dL — AB
SPECIFIC GRAVITY, URINE: 1.01 (ref 1.005–1.030)

## 2015-11-06 LAB — HIV ANTIBODY (ROUTINE TESTING W REFLEX): HIV Screen 4th Generation wRfx: NONREACTIVE

## 2015-11-06 LAB — POCT FERN TEST

## 2015-11-06 LAB — URINE MICROSCOPIC-ADD ON

## 2015-11-06 LAB — POCT PREGNANCY, URINE: Preg Test, Ur: POSITIVE — AB

## 2015-11-06 LAB — HCG, QUANTITATIVE, PREGNANCY: hCG, Beta Chain, Quant, S: 29018 m[IU]/mL — ABNORMAL HIGH (ref <5)

## 2015-11-06 MED ORDER — LIDOCAINE HCL (PF) 1 % IJ SOLN
30.0000 mL | INTRAMUSCULAR | Status: DC | PRN
  Filled 2015-11-06: qty 30

## 2015-11-06 MED ORDER — WITCH HAZEL-GLYCERIN EX PADS: 1.0000 "application " | MEDICATED_PAD | CUTANEOUS | Status: DC | PRN

## 2015-11-06 MED ORDER — DIPHENHYDRAMINE HCL 25 MG PO CAPS: 25.0000 mg | ORAL_CAPSULE | Freq: Four times a day (QID) | ORAL | Status: DC | PRN

## 2015-11-06 MED ORDER — IBUPROFEN 600 MG PO TABS
600.0000 mg | ORAL_TABLET | Freq: Four times a day (QID) | ORAL | Status: DC
  Administered 2015-11-06 – 2015-11-08 (×9): 600 mg via ORAL
  Filled 2015-11-06 (×9): qty 1

## 2015-11-06 MED ORDER — ONDANSETRON HCL 4 MG/2ML IJ SOLN
4.0000 mg | Freq: Four times a day (QID) | INTRAMUSCULAR | Status: DC | PRN
Start: 2015-11-06 — End: 2015-11-06

## 2015-11-06 MED ORDER — LACTATED RINGERS IV SOLN: 500.0000 mL | INTRAVENOUS | Status: DC | PRN

## 2015-11-06 MED ORDER — PRENATAL MULTIVITAMIN CH
1.0000 | ORAL_TABLET | Freq: Every day | ORAL | Status: DC
  Administered 2015-11-07 – 2015-11-08 (×2): 1 via ORAL
  Filled 2015-11-06 (×2): qty 1

## 2015-11-06 MED ORDER — ONDANSETRON HCL 4 MG PO TABS
4.0000 mg | ORAL_TABLET | ORAL | Status: DC | PRN
Start: 2015-11-06 — End: 2015-11-08

## 2015-11-06 MED ORDER — CITRIC ACID-SODIUM CITRATE 334-500 MG/5ML PO SOLN: 30.0000 mL | ORAL | Status: DC | PRN

## 2015-11-06 MED ORDER — MAGNESIUM SULFATE BOLUS VIA INFUSION
4.0000 g | Freq: Once | INTRAVENOUS | Status: DC
  Filled 2015-11-06: qty 500

## 2015-11-06 MED ORDER — SIMETHICONE 80 MG PO CHEW: 80.0000 mg | CHEWABLE_TABLET | ORAL | Status: DC | PRN

## 2015-11-06 MED ORDER — LIDOCAINE HCL (PF) 1 % IJ SOLN
INTRAMUSCULAR | Status: AC
  Filled 2015-11-06: qty 30

## 2015-11-06 MED ORDER — LACTATED RINGERS IV SOLN
INTRAVENOUS | Status: DC
  Administered 2015-11-06: 09:00:00 via INTRAVENOUS

## 2015-11-06 MED ORDER — OXYTOCIN BOLUS FROM INFUSION
500.0000 mL | INTRAVENOUS | Status: DC
  Administered 2015-11-06: 500 mL via INTRAVENOUS

## 2015-11-06 MED ORDER — DIBUCAINE 1 % RE OINT: 1.0000 "application " | TOPICAL_OINTMENT | RECTAL | Status: DC | PRN

## 2015-11-06 MED ORDER — ZOLPIDEM TARTRATE 5 MG PO TABS: 5.0000 mg | ORAL_TABLET | Freq: Every evening | ORAL | Status: DC | PRN

## 2015-11-06 MED ORDER — ACETAMINOPHEN 325 MG PO TABS: 650.0000 mg | ORAL_TABLET | ORAL | Status: DC | PRN

## 2015-11-06 MED ORDER — SENNOSIDES-DOCUSATE SODIUM 8.6-50 MG PO TABS
2.0000 | ORAL_TABLET | ORAL | Status: DC
  Administered 2015-11-07 – 2015-11-08 (×2): 2 via ORAL
  Filled 2015-11-06 (×2): qty 2

## 2015-11-06 MED ORDER — ONDANSETRON HCL 4 MG/2ML IJ SOLN: 4.0000 mg | INTRAMUSCULAR | Status: DC | PRN

## 2015-11-06 MED ORDER — SODIUM CHLORIDE 0.9 % IV SOLN
2.0000 g | Freq: Once | INTRAVENOUS | Status: AC
  Administered 2015-11-06: 2 g via INTRAVENOUS
  Filled 2015-11-06: qty 2000

## 2015-11-06 MED ORDER — OXYTOCIN 10 UNIT/ML IJ SOLN
INTRAVENOUS | Status: AC
  Administered 2015-11-06: 2.5 [IU]/h via INTRAVENOUS
  Filled 2015-11-06: qty 4

## 2015-11-06 MED ORDER — TETANUS-DIPHTH-ACELL PERTUSSIS 5-2.5-18.5 LF-MCG/0.5 IM SUSP
0.5000 mL | Freq: Once | INTRAMUSCULAR | Status: AC
  Administered 2015-11-08: 0.5 mL via INTRAMUSCULAR
  Filled 2015-11-06: qty 0.5

## 2015-11-06 MED ORDER — MAGNESIUM SULFATE 50 % IJ SOLN
2.0000 g/h | INTRAVENOUS | Status: DC
  Filled 2015-11-06: qty 80

## 2015-11-06 MED ORDER — BETAMETHASONE SOD PHOS & ACET 6 (3-3) MG/ML IJ SUSP
12.0000 mg | INTRAMUSCULAR | Status: DC
  Administered 2015-11-06: 12 mg via INTRAMUSCULAR
  Filled 2015-11-06 (×2): qty 2

## 2015-11-06 MED ORDER — OXYTOCIN 10 UNIT/ML IJ SOLN
2.5000 [IU]/h | INTRAVENOUS | Status: DC
  Administered 2015-11-06: 2.5 [IU]/h via INTRAVENOUS

## 2015-11-06 MED ORDER — ACETAMINOPHEN 325 MG PO TABS
650.0000 mg | ORAL_TABLET | ORAL | Status: DC | PRN
  Administered 2015-11-06 – 2015-11-07 (×2): 650 mg via ORAL
  Filled 2015-11-06 (×2): qty 2

## 2015-11-06 MED ORDER — OXYCODONE-ACETAMINOPHEN 5-325 MG PO TABS
1.0000 | ORAL_TABLET | ORAL | Status: DC | PRN
  Administered 2015-11-06: 1 via ORAL
  Filled 2015-11-06: qty 1

## 2015-11-06 MED ORDER — OXYTOCIN 10 UNIT/ML IJ SOLN
INTRAMUSCULAR | Status: AC
  Filled 2015-11-06: qty 1

## 2015-11-06 MED ORDER — BENZOCAINE-MENTHOL 20-0.5 % EX AERO: 1.0000 "application " | INHALATION_SPRAY | CUTANEOUS | Status: DC | PRN

## 2015-11-06 MED ORDER — LANOLIN HYDROUS EX OINT: TOPICAL_OINTMENT | CUTANEOUS | Status: DC | PRN

## 2015-11-06 MED ORDER — OXYCODONE-ACETAMINOPHEN 5-325 MG PO TABS: 2.0000 | ORAL_TABLET | ORAL | Status: DC | PRN

## 2015-11-06 NOTE — MAU Provider Note (Signed)
History     CSN: [id number]  Arrival date and time: 11/06/15 0758   None     Chief Complaint  Patient presents with  . Possible Pregnancy  . Chest Pain  . Vaginal Bleeding   HPI   [name] is a 30 y.o. female [phone] at [redacted]w[redacted]d (exact LMP unknown), no prenatal care presents to MAU with vaginal bleeding and contractions. The spotting started last night around 0200; she started having strong contractions around that time. She has a history of one preterm; 34 week delivery.   She currently rates her contraction pain 10/10.   OB History    Gravida Para Term Preterm AB TAB SAB Ectopic Multiple Living   Past Medical History  Diagnosis Date  . Medical history non-contributory   . Preterm labor   . Anemia   . Hypertension   . GERD (gastroesophageal reflux disease)     Past Surgical History  Procedure Laterality Date  . No past surgeries      Family History  Problem Relation Age of Onset  . Diabetes Maternal Aunt   . Diabetes Maternal Grandmother     Social History  Substance Use Topics  . Smoking status: Former Games developer  . Smokeless tobacco: Never Used  . Alcohol Use: No    Allergies: No Known Allergies  Prescriptions prior to admission  Medication Sig Dispense Refill Last Dose  . acetaminophen (TYLENOL) 500 MG tablet Take 500 mg by mouth every 6 (six) hours as needed for mild pain.   11/12/2014 at Unknown time  . HYDROcodone-acetaminophen (NORCO/VICODIN) 5-325 MG per tablet Take 1-2 tablets by mouth every 6 (six) hours as needed. 10 tablet 0   . ibuprofen (ADVIL,MOTRIN) 600 MG tablet Take 1 tablet (600 mg total) by mouth every 6 (six) hours. (Patient not taking: Reported on 11/13/2014) 30 tablet 0   . omeprazole (PRILOSEC OTC) 20 MG tablet Take 20 mg by mouth daily as needed (acid reflux).    couple months  . ondansetron (ZOFRAN) 4 MG tablet Take 1 tablet (4 mg total) by mouth every 6 (six) hours. 12 tablet 0   .  oxyCODONE-acetaminophen (PERCOCET/ROXICET) 5-325 MG per tablet Take 1-2 tablets by mouth every 4 (four) hours as needed for severe pain (moderate - severe pain). (Patient not taking: Reported on 11/13/2014) 30 tablet 0   . Prenatal Vit-Fe Fumarate-FA (PRENATAL MULTIVITAMIN) TABS tablet Take 1 tablet by mouth daily at 12 noon.   01/08/2014 at Unknown time   Results for orders placed or performed during the hospital encounter of 11/06/15 (from the past 48 hour(s))  Pregnancy, urine POC     Status: Abnormal   Collection Time: 11/06/15  8:14 AM  Result Value Ref Range   Preg Test, Ur POSITIVE (A) NEGATIVE    Comment:        THE SENSITIVITY OF THIS METHODOLOGY IS >24 mIU/mL     Review of Systems  Gastrointestinal: Positive for heartburn, nausea, vomiting and abdominal pain.   Physical Exam   Blood pressure 127/72, pulse 77, temperature 98 F (36.7 C), temperature source Oral, resp. rate 20, last menstrual period 05/15/2015, SpO2 98 %, unknown if currently breastfeeding.  Physical Exam  Constitutional: She is oriented to person, place, and time. She appears well-developed and well-nourished. No distress.  HENT:  Head: Normocephalic.  Eyes: Pupils are equal, round, and reactive to light.  Genitourinary:  Cervix: 6, 80% Speculum  inserted initially; small amount of clear fluid pooling in the vagina. Small amount of bright red blood.   Neurological: She is alert and oriented to person, place, and time.  Skin: Skin is warm. She is not diaphoretic.  Psychiatric: Her behavior is normal.    MAU Course  Procedures  None   MDM Dr. Adrian BlackwaterStinson called to room 10 in MAU.  IV started NICU called   Assessment and Plan   A:  Preterm labor @ 1040w0d No prenatal care.   P:  Transfer to room 170 on labor and delivery; delivery eminent    Duane LopeJennifer I Jamacia Jester, NP 11/06/2015 8:53 AM

## 2015-11-06 NOTE — H&P (Signed)
History     CSN: [id number]  Arrival date and time: 11/06/15 0758   None     Chief Complaint  Patient presents with  . Possible Pregnancy  . Chest Pain  . Vaginal Bleeding   HPI   [name] is a 29 y.o. female G5P2204 at [redacted]w[redacted]d (exact LMP unknown), no prenatal care presents to MAU with vaginal bleeding and contractions. The spotting started last night around 0200; she started having strong contractions around that time. She has a history of one preterm; 34 week delivery.   She currently rates her contraction pain 10/10.   OB History    Gravida Para Term Preterm AB TAB SAB Ectopic Multiple Living   5 4 2 2      4      Past Medical History  Diagnosis Date  . Medical history non-contributory   . Preterm labor   . Anemia   . Hypertension   . GERD (gastroesophageal reflux disease)     Past Surgical History  Procedure Laterality Date  . No past surgeries      Family History  Problem Relation Age of Onset  . Diabetes Maternal Aunt   . Diabetes Maternal Grandmother     Social History  Substance Use Topics  . Smoking status: Former Smoker  . Smokeless tobacco: Never Used  . Alcohol Use: No    Allergies: No Known Allergies  Prescriptions prior to admission  Medication Sig Dispense Refill Last Dose  . acetaminophen (TYLENOL) 500 MG tablet Take 500 mg by mouth every 6 (six) hours as needed for mild pain.   11/12/2014 at Unknown time  . HYDROcodone-acetaminophen (NORCO/VICODIN) 5-325 MG per tablet Take 1-2 tablets by mouth every 6 (six) hours as needed. 10 tablet 0   . ibuprofen (ADVIL,MOTRIN) 600 MG tablet Take 1 tablet (600 mg total) by mouth every 6 (six) hours. (Patient not taking: Reported on 11/13/2014) 30 tablet 0   . omeprazole (PRILOSEC OTC) 20 MG tablet Take 20 mg by mouth daily as needed (acid reflux).    couple months  . ondansetron (ZOFRAN) 4 MG tablet Take 1 tablet (4 mg total) by mouth every 6 (six) hours. 12 tablet 0   .  oxyCODONE-acetaminophen (PERCOCET/ROXICET) 5-325 MG per tablet Take 1-2 tablets by mouth every 4 (four) hours as needed for severe pain (moderate - severe pain). (Patient not taking: Reported on 11/13/2014) 30 tablet 0   . Prenatal Vit-Fe Fumarate-FA (PRENATAL MULTIVITAMIN) TABS tablet Take 1 tablet by mouth daily at 12 noon.   01/08/2014 at Unknown time   Results for orders placed or performed during the hospital encounter of 11/06/15 (from the past 48 hour(s))  Pregnancy, urine POC     Status: Abnormal   Collection Time: 11/06/15  8:14 AM  Result Value Ref Range   Preg Test, Ur POSITIVE (A) NEGATIVE    Comment:        THE SENSITIVITY OF THIS METHODOLOGY IS >24 mIU/mL     Review of Systems  Gastrointestinal: Positive for heartburn, nausea, vomiting and abdominal pain.   Physical Exam   Blood pressure 127/72, pulse 77, temperature 98 F (36.7 C), temperature source Oral, resp. rate 20, last menstrual period 05/15/2015, SpO2 98 %, unknown if currently breastfeeding.  Physical Exam  Constitutional: She is oriented to person, place, and time. She appears well-developed and well-nourished. No distress.  HENT:  Head: Normocephalic.  Eyes: Pupils are equal, round, and reactive to light.  Genitourinary:  Cervix: 6, 80% Speculum   inserted initially; small amount of clear fluid pooling in the vagina. Small amount of bright red blood.   Neurological: She is alert and oriented to person, place, and time.  Skin: Skin is warm. She is not diaphoretic.  Psychiatric: Her behavior is normal.    MAU Course  Procedures  None   MDM Dr. Stinson called to room 10 in MAU.  IV started NICU called   Assessment and Plan   A:  Preterm labor @ [redacted]w[redacted]d No prenatal care.   P:  Transfer to room 170 on labor and delivery; delivery eminent    Markon Jares I Robb Sibal, NP 11/06/2015 8:53 AM  

## 2015-11-06 NOTE — Progress Notes (Addendum)
Patient arrived on unit with MAU RN and mid-level MAU provider at 0840 am.  Patient in 10/10 pain. Stinson and Nicu team notified of patients arrival.

## 2015-11-06 NOTE — MAU Note (Addendum)
C/o vaginal pain and bleeding since 0200 this AM, c/o chest pain for past hr;no prenatal care; by period dates pt is approximately 25 weeks- but dates are unsure; FOB with pt;

## 2015-11-06 NOTE — H&P (Signed)
LABOR AND DELIVERY ADMISSION HISTORY AND PHYSICAL NOTE  [name] is a 30 y.o. [phone] with IUP at [redacted]w[redacted]d (exact LMP unknown) with no prenatal care and a PMHx significant for preterm labor x2 presenting for fluid leakage and contractions. Fetus was estimated to be approx. 28 weeks via fundal height per Dr. Adrian Blackwater. Patient awoke to painful contractions at 0200. After urination, patient noticed pink spotting upon wiping. She had a similar episode at 0600. Rushed to hospital after dropping children to bus stop at approx. 0700. Leakage of runny, water-like discharge present, has occurred for the two days. Patient is unsure of ROM, but states that she has felt increasingly wet for 2 days. Patient has also had increased N/V and left sided headaches for the past two days, vomiting 2-3x per day, denies fever. Patient also states that she has just overcome a "stomach virus" that has spread through the family.  History obtained postpartum.  Prenatal History/Complications:  Past Medical History: Past Medical History  Diagnosis Date  . Medical history non-contributory   . Preterm labor   . Anemia   . Hypertension   . GERD (gastroesophageal reflux disease)     Past Surgical History: Past Surgical History  Procedure Laterality Date  . No past surgeries     Wisdom tooth removal - 4 wisdom teeth removed  Obstetrical History: OB History    Gravida Para Term Preterm AB TAB SAB Ectopic Multiple Living   0 5      Social History: Social History   Social History  . Marital Status: Single    Spouse Name: N/A  . Number of Children: N/A  . Years of Education: N/A   Social History Main Topics  . Smoking status: Former Games developer  . Smokeless tobacco: Never Used  . Alcohol Use: No  . Drug Use: No  . Sexual Activity: Yes    Birth Control/ Protection: Injection   Other Topics Concern  . None   Social History Narrative    Family History: Family History  Problem Relation  Age of Onset  . Diabetes Maternal Aunt   . Diabetes Maternal Grandmother     Allergies: No Known Allergies  Prescriptions prior to admission  Medication Sig Dispense Refill Last Dose  . acetaminophen (TYLENOL) 500 MG tablet Take 500 mg by mouth every 6 (six) hours as needed for mild pain.   11/12/2014 at Unknown time  . HYDROcodone-acetaminophen (NORCO/VICODIN) 5-325 MG per tablet Take 1-2 tablets by mouth every 6 (six) hours as needed. 10 tablet 0   . ibuprofen (ADVIL,MOTRIN) 600 MG tablet Take 1 tablet (600 mg total) by mouth every 6 (six) hours. (Patient not taking: Reported on 11/13/2014) 30 tablet 0   . omeprazole (PRILOSEC OTC) 20 MG tablet Take 20 mg by mouth daily as needed (acid reflux).    couple months  . ondansetron (ZOFRAN) 4 MG tablet Take 1 tablet (4 mg total) by mouth every 6 (six) hours. 12 tablet 0   . oxyCODONE-acetaminophen (PERCOCET/ROXICET) 5-325 MG per tablet Take 1-2 tablets by mouth every 4 (four) hours as needed for severe pain (moderate - severe pain). (Patient not taking: Reported on 11/13/2014) 30 tablet 0   . Prenatal Vit-Fe Fumarate-FA (PRENATAL MULTIVITAMIN) TABS tablet Take 1 tablet by mouth daily at 12 noon.   01/08/2014 at Unknown time   Zantac (ranitidine) for GERD exacerbations  Review of Systems   ROS reflects antepartum status. 3/25-3/26  Review of Systems  Constitutional: Positive for malaise/fatigue. Negative for fever and chills.  HENT: Negative for congestion, ear discharge, ear pain, hearing loss, nosebleeds and sore throat.   Eyes: Negative for blurred vision, double vision, pain, discharge and redness.  Respiratory: Negative for cough, hemoptysis, sputum production, shortness of breath and wheezing.   Cardiovascular: Negative for chest pain, palpitations, orthopnea and leg swelling.  Gastrointestinal: Positive for heartburn, nausea and vomiting. Negative for abdominal pain, diarrhea, constipation, blood in stool and melena.  Genitourinary:  Positive for urgency and frequency. Negative for dysuria and hematuria.  Musculoskeletal: Negative for neck pain.  Neurological: Positive for headaches. Negative for focal weakness, seizures, loss of consciousness and weakness.    Blood pressure 127/69, pulse 73, temperature 98 F (36.7 C), temperature source Oral, resp. rate 25, last menstrual period 05/15/2015, SpO2 100 %, unknown if currently breastfeeding. General appearance: cooperative, fatigued and no distress Lungs: CTAB Heart: RRR Extremities: No calf swelling or tenderness Presentation: cephalic Dilation: 10 Effacement (%): 100 Exam by:: Dr. Adrian Blackwater   Prenatal labs: ABO, Rh:  O POS Rubella: Immune, 2015 records RPR:   Pending HBsAg:   Pending HIV:   Pending GBS:   Unknown 1 hr Glucola: Unknown Genetic screening:  Not completed  Anatomy US: N/A  Prenatal Transfer Tool  Maternal Diabetes: No Genetic Screening: none Maternal Ultrasounds/Referrals: none Fetal Ultrasounds or other Referrals:  none Maternal Substance Abuse:  Yes:  Type: Marijuana Significant Maternal Medications:  Meds include: Zantac Significant Maternal Lab Results: GBS unknown  Results for orders placed or performed during the hospital encounter of 11/06/15 (from the past 24 hour(s))  Urinalysis, Routine w reflex microscopic (not at [facility])   Collection Time: 11/06/15  8:05 AM  Result Value Ref Range   Color, Urine YELLOW YELLOW   APPearance CLOUDY (A) CLEAR   Specific Gravity, Urine 1.010 1.005 - 1.030   pH 8.5 (H) 5.0 - 8.0   Glucose, UA NEGATIVE NEGATIVE mg/dL   Hgb urine dipstick LARGE (A) NEGATIVE   Bilirubin Urine SMALL (A) NEGATIVE   Ketones, ur >80 (A) NEGATIVE mg/dL   Protein, ur 30 (A) NEGATIVE mg/dL   Nitrite NEGATIVE NEGATIVE   Leukocytes, UA LARGE (A) NEGATIVE  Urine microscopic-add on   Collection Time: 11/06/15  8:05 AM  Result Value Ref Range   Squamous Epithelial / LPF 6-30 (A) NONE SEEN   WBC, UA 6-30 0 - 5 WBC/hpf   RBC  / HPF 6-30 0 - 5 RBC/hpf   Bacteria, UA MANY (A) NONE SEEN   Urine-Other MUCOUS PRESENT   Pregnancy, urine POC   Collection Time: 11/06/15  8:14 AM  Result Value Ref Range   Preg Test, Ur POSITIVE (A) NEGATIVE  CBC   Collection Time: 11/06/15  8:30 AM  Result Value Ref Range   WBC 25.8 (H) 4.0 - 10.5 K/uL   RBC 3.05 (L) 3.87 - 5.11 MIL/uL   Hemoglobin 9.6 (L) 12.0 - 15.0 g/dL   HCT 98.1 (L) 19.1 - 47.8 %   MCV 90.8 78.0 - 100.0 fL   MCH 31.5 26.0 - 34.0 pg   MCHC 34.7 30.0 - 36.0 g/dL   RDW 29.5 62.1 - 30.8 %   Platelets 364 150 - 400 K/uL  hCG, quantitative, pregnancy   Collection Time: 11/06/15  8:30 AM  Result Value Ref Range   hCG, Beta Chain, Quant, S 65784 (H) <5 mIU/mL  Fern Test   Collection Time: 11/06/15  9:13 AM  Result Value Ref Range   POCT  Fern Test      Patient Active Problem List   Diagnosis Date Noted  . Preterm delivery, delivered 11/06/2015    Assessment: [name] is a 30 y.o. [phone] for IUP at 4742w0d (exact LMP unknown, est. 28 weeks via fundal height) with a PMHx significant for preterm labor x2 presenting for fluid leakage and contractions. SVD with EBL <100 mL, no lacerations. Baby currently in NICU.   #Labor: SOL, SVD. #Pain: Tolerated on percocet #FWB: Baby in NICU. #ID:  GBS unknown #MOF: breast #MOC: BTL (needs papers) #Circ:  n/a  LEFTWICH-KIRBY, Patsye Sullivant, CNM 4:32 PM

## 2015-11-06 NOTE — Consult Note (Signed)
Neonatology Note:  Attendance at Vaginal Delivery:   Our team responded to a call to room # 170 for expected SVD of a extremely preterm infant to a mother without prenatal care.  The requesting physician was Dr. Adrian BlackwaterStinson. The mother is a 30 y.o. [id number] with IUP at 1623w0d though measuring 28 cm at fundus, labs unknown. No bleeding or fetal distress.  ROM occurred at delivery with some blood otherwise clear. At delivery, the baby was active with good tone and respiratory effort.  Delayed cord clamping completed for 60 sec. Brought to warmer and dried, warmed and stimulated.  CPAP initiated due to sub optimal effort.  HR <100 and trending down thus a short course PPV was given with great response.  SaO2 placed and O2 titrated accordingly.  Ap 6/9.  CPAP continued with fio2 40%.  Good perfusion and pulses including respiratory effort. I spoke with the parents in the DR, then transferred the baby to the NICU for further management.   Jamie Brookesavid Savva Beamer, MD

## 2015-11-07 ENCOUNTER — Encounter (HOSPITAL_COMMUNITY): Payer: Self-pay | Admitting: Family Medicine

## 2015-11-07 ENCOUNTER — Encounter (HOSPITAL_COMMUNITY): Payer: Self-pay | Admitting: *Deleted

## 2015-11-07 LAB — RPR: RPR Ser Ql: NONREACTIVE

## 2015-11-07 NOTE — Progress Notes (Signed)
POSTPARTUM PROGRESS NOTE  Post Partum Day 1 Subjective:  [name] is a 30 y.o. [id number] 5929w0d s/p precipitious pre-term vaginal delivery.  No acute events overnight.  Pt denies problems with ambulating, voiding or po intake.  She denies nausea or vomiting.  Pain is well controlled.  She has had flatus. She has not had bowel movement.  Lochia Small.   Objective: Blood pressure 113/60, pulse 60, temperature 97.9 F (36.6 C), temperature source Oral, resp. rate 15, last menstrual period 05/15/2015, SpO2 100 %, unknown if currently breastfeeding.  Physical Exam:  General: alert, cooperative and no distress Lochia:normal flow Chest: CTAB Heart: RRR no m/r/g Abdomen: +BS, soft, nontender,  Uterine Fundus: firm,  DVT Evaluation: No calf swelling or tenderness Extremities: trace edema   Recent Labs  11/06/15 0830  HGB 9.6*  HCT 27.7*    Assessment/Plan:  ASSESSMENT: [name] is a 30 y.o. [id number] 7529w0d s/p nsvd, doing well. Wants btl, will have our administrator bring paperwork for patient to sign.  Plan for discharge tomorrow   LOS: 1 day   Silvano Bilisoah B Imunique Samad 11/07/2015, 12:02 PM

## 2015-11-07 NOTE — Lactation Note (Signed)
This note was copied from a baby's chart. Lactation Consultation Note  Patient Name: GIRL Len ChildsDominique Shingledecker ZOXWR'UToday's Date: 11/07/2015 Reason for consult: Initial assessment   With this mom of a [redacted] week gestation NICU baby, now 8630 hours old. Mom was not going to provide EBM, but decided at begin pumping after speaking to the neonatologist. Mom was going to give the baby up for adoption, but at this time, she said she has changed her mind. Mom pumped earlier today, and did not express any milk. I showed her how to hand express, and she expressed about 1 ml of colostrum. Mom was smiling and eager to bring the milk to her baby. I reviewed the nICU booklet and lactation services. I faxed WIC for mom to apply as needed, and to get a DEP. [facility] tomorrow.    Maternal Data Formula Feeding for Exclusion: Yes Reason for exclusion: Mother's choice to formula feed on admision (baby in NICU) Has patient been taught Hand Expression?: Yes Does the patient have breastfeeding experience prior to this delivery?: No  Feeding    LATCH Score/Interventions                      Lactation Tools Discussed/Used WIC Program: Yes (fax sent for mom to apply/and to get DEP) Pump Review: Setup, frequency, and cleaning;Milk Storage;Other (comment) (hand expression and initiation setting) Initiated by:: bedisde RN on 24 hours after baby's birth - mom's original choie of feeding was formula, but when educated about bendfits, decided to pump Date initiated:: 11/07/15   Consult Status Consult Status: Follow-up Date: 11/08/15 Follow-up type: In-patient    Alfred LevinsLee, Quinta Eimer Anne 11/07/2015, 4:54 PM

## 2015-11-07 NOTE — Clinical Social Work Maternal (Signed)
CLINICAL SOCIAL WORK MATERNAL/CHILD NOTE  Patient Details  Name: [name] MRN: [id number] Date of Birth: 08/14/1985  Date:  11/07/2015  Clinical Social Worker Initiating Note:  Shahil Speegle E. Brigitte Pulse, [place] Date/ Time Initiated:  11/07/15/1548     Child's Name:  [name]   Legal Guardian:   (Parents: Leavy Cella and Jenene Slicker)   Need for Interpreter:  None   Date of Referral:  11/07/15     Reason for Referral:  Late or No Prenatal Care    Referral Source:  CNM   Address:  [address], [place],  [address]  Phone number:  [phone]   Household Members:  Significant Other, Minor Children (MOB has a 2 year old child at home.  The couple have children in the home ages 2, 28, and almost 2. )   Natural Supports (not living in the home):  Extended Family (MOB reports that she has a good support system.  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  FOB was recently laid off from his job in [place] at [employer].)   Education:      Financial Resources:  [employer]   Other Resources:  Physicist, medical , [place] Considerations Which May Impact Care: None stated.  Strengths:  Ability to meet basic needs , Understanding of illness   Risk Factors/Current Problems:  Other (Comment) (MOB is in the process of determining if she plans to parent or make adoption plan for baby.)   Cognitive State:  Alert , Able to Concentrate , Linear Thinking , Goal Oriented , Insightful    Mood/Affect:  Interested , Calm , Relaxed    CSW Assessment: CSW met with MOB in her third floor room/316 to introduce services, offer support, and complete assessment due to baby's admission to NICU at approximately 30 weeks.  CSW received consult for [facility] and has received consent form from [place] to exchange  information about baby for adoption purposes. MOB was pleasant and welcoming and easy to engage.  She was pumping when CSW arrived, but states she feels comfortable to talk at the same time.  MOB reports feeling well physically aside from cramping and seems to have a good understanding of baby's current medical condition.  MOB reports that she has 4 children at home, ages 51, 6, 75 in April and 2 in May.  She reports she and FOB are in a relationship and that he is involved and supportive.  She states he is not here today because of his aunt's funeral in [place].  He is the father of all her children but her first.  She reports that the kids are currently with "Gramma" who is also a good support.   CSW informed MOB that [facility] has contacted CSW and provided CSW with a consent signed by MOB.  CSW asked if she feels comfortable discussing this.  MOB was very open with CSW and states she does not think she will go through with making an adoption plan, but states she feels very overwhelmed with their current financial situation and having another baby.  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  She expects that she will be out of work a total of 3-4 weeks.  She states FOB was recently laid off.  She reports that she brought up the idea of adoption to FOB just yesterday when everything felt too much to handle.  She states FOB called the adoption agency just to get information so that they could make an informed decision.  She states she still needs to talk with FOB about his thoughts and wishes, but does not think it is something she will go through with.  CSW discussed resources such as Medicaid, Food Stamps, and WIC with MOB.  She states she receives all of these services.  MOB reports she does not have baby supplies and is concerned about getting them.  CSW informed her of resources for baby basics and a bassinet from [employer] should she decide to parent.  CSW also offered to make a referral for grant funds through the [place].  MOB was very Patent attorney.  CSW encouraged MOB to talk with FOB and not to make a decision based on anyone but her and FOB.  CSW explained that we are here to support her in either direction that she chooses to go and encouraged her to take the time she needs to make a decision.  CSW asked if she feels like she wants contact and to be asked to sign consents at this time, or if she feels this is making things harder for her.  She very confidently stated that she wants to be involved with baby, get updates and make medical decisions.  CSW asked her to call CSW if she feels she needs someone to talk with at any time.  MOB seemed very appreciative. CSW inquired about her [facility] and she states she is still awaiting a letter that she has been approved for Pregnancy Medicaid and states the OBGYN office that she went to with her other children [facility]) stated that she could not be a patient until Pregnancy Medicaid was approved.  CSW explained hospital drug screen policy given [facility] and MOB states understanding.  She denies all substance use.   CSW inquired about MOB's emotions during her previous postpartum time periods.  MOB states minimal PPD symptoms after her last birth, but states they didn't last long.  CSW provided education regarding signs and symptoms of perinatal mood disorders and stressed the importance of talking with CSW and or her MD if she has concerns about her mental/emotional health at any time.  MOB agreed. CSW has no social concerns at this time and will continue to monitor to provide support and assistance as needed/desired by the family.  CSW explained ongoing support services offered by NICU CSW and gave contact information.    CSW Plan/Description:  Engineer, mining , Information/Referral to [employer] , Psychosocial Support and Ongoing Assessment  of Needs    Alphonzo Cruise, [place] 11/07/2015, 4:12 PM

## 2015-11-07 NOTE — Progress Notes (Signed)
UR chart review completed.  

## 2015-11-08 MED ORDER — IBUPROFEN 600 MG PO TABS: 600.0000 mg | ORAL_TABLET | Freq: Four times a day (QID) | ORAL | Status: DC

## 2015-11-08 NOTE — Discharge Instructions (Signed)

## 2015-11-08 NOTE — Lactation Note (Signed)
This note was copied from a baby's chart. Lactation Consultation Note  Patient Name: GIRL Len ChildsDominique Dials ZOXWR'UToday's Date: 11/08/2015 Reason for consult: Follow-up assessment;NICU baby NICU baby 5949 hours old. Mom reports that she has not pumped in over 13 hours. Enc mom to pump 8 times/24 hours followed by hand expression. Discussed a pumping schedule that allows for sleeping at night. [facility] Demonstrated use of piston for double hand-pumping. [facility], but declined at this time. Mom aware of pumping rooms in NICU, how to transport EBM to hospital, and OP/BGSG and LC phone line assistance after D/C.   Maternal Data    Feeding    LATCH Score/Interventions                      Lactation Tools Discussed/Used     Consult Status Consult Status: PRN    Geralynn OchsWILLIARD, Yosiah Jasmin 11/08/2015, 9:56 AM

## 2015-11-08 NOTE — Progress Notes (Signed)
Pt ambulated out teaching complete  

## 2015-11-08 NOTE — Progress Notes (Signed)
CSW received information from Adoption agency representatives that MOB has decided to parent, not make an adoption plan.   

## 2015-11-08 NOTE — Discharge Summary (Signed)
OB Discharge Summary     Patient Name: [name] DOB: 08/03/1986 MRN: [id number]  Date of admission: 11/06/2015 Delivering MD: Despina Pole   Date of discharge: 11/08/2015  Admitting diagnosis: PREG,BLEEDING,CHEST PAINS Intrauterine pregnancy: [redacted]w[redacted]d     Secondary diagnosis:  Active Problems:   Preterm delivery, delivered  Additional problems: none3     Discharge diagnosis: Preterm Pregnancy Delivered                                                                                                Post partum procedures:none  Augmentation: none  Complications: None  Hospital course:  Onset of Labor With Vaginal Delivery     30 y.o. yo [id number] at [redacted]w[redacted]d was admitted in Active Labor on 11/06/2015. Patient had an uncomplicated labor course as follows:  Membrane Rupture Time/Date: 6:00 AM ,11/04/2015   Intrapartum Procedures: Episiotomy: None [1]                                         Lacerations:  None [1]  Patient had a delivery of a Viable infant after precipitious labor. 11/06/2015  Information for the patient's newborn:  Shaynah, Hund [[id number]]  Delivery Method: Vaginal, Spontaneous Delivery (Filed from Delivery Summary)    Pateint had an uncomplicated postpartum course.  She is ambulating, tolerating a regular diet, passing flatus, and urinating well. Patient is discharged home in stable condition on 11/08/2015.  No prenatal care. SW saw, OK to discharge. Desires BTL, paperwork signed, admin staff have been messaged to contact patient for tubal consult in 2 weeks.   Physical exam  Filed Vitals:   11/07/15 1200 11/07/15 1903 11/07/15 2118 11/08/15 0548  BP: 122/66 92/67 124/47 103/47  Pulse: 71 86 65 62  Temp: 97.7 F (36.5 C) 98 F (36.7 C) 98.3 F (36.8 C) 98.7 F (37.1 C)  TempSrc: Oral Oral Oral Oral  Resp: SpO2: 100% 100% 100% 100%   General: alert, cooperative and no distress Lochia: appropriate Uterine Fundus:  firm Incision: No significant erythema, Dressing is clean, dry, and intact DVT Evaluation: No cords or calf tenderness. No significant calf/ankle edema. Labs: Lab Results  Component Value Date   WBC 25.8* 11/06/2015   HGB 9.6* 11/06/2015   HCT 27.7* 11/06/2015   MCV 90.8 11/06/2015   PLT 364 11/06/2015   CMP Latest Ref Rng 11/13/2014  Glucose 70 - 99 mg/dL 87  BUN 6 - 23 mg/dL 9  Creatinine 1.30 - 8.65 mg/dL 7.84  Sodium 696 - 295 mmol/L 139  Potassium 3.5 - 5.1 mmol/L 3.6  Chloride 96 - 112 mmol/L 112  CO2 19 - 32 mmol/L 23  Calcium 8.4 - 10.5 mg/dL 8.1(L)  Total Protein 6.0 - 8.3 g/dL 2.8(U)  Total Bilirubin 0.3 - 1.2 mg/dL 0.4  Alkaline Phos 39 - 117 U/L 39  AST 0 - 37 U/L 15  ALT 0 - 35 U/L 14    Discharge instruction: per After Visit Summary and "  Baby and Me Booklet".  After visit meds:    Medication List    STOP taking these medications        omeprazole 20 MG tablet  Commonly known as:  PRILOSEC OTC      TAKE these medications        acetaminophen 500 MG tablet  Commonly known as:  TYLENOL  Take 500 mg by mouth every 6 (six) hours as needed for mild pain.     ibuprofen 600 MG tablet  Commonly known as:  ADVIL,MOTRIN  Take 1 tablet (600 mg total) by mouth every 6 (six) hours.        Diet: routine diet  Activity: Advance as tolerated. Pelvic rest for 6 weeks.   Outpatient follow up: 2 wks tubal appt; 6 wks pp visit Follow up Appt:No future appointments. Follow up Visit:No Follow-up on file.  Postpartum contraception: btl interval  Newborn Data: Live born female  Birth Weight: 3 lb 3.5 oz (1460 g) APGAR: 6, 9  Baby Feeding: Bottle Disposition:NICU   11/08/2015 Silvano BilisNoah B Brandelyn Henne, MD

## 2015-11-23 ENCOUNTER — Ambulatory Visit (INDEPENDENT_AMBULATORY_CARE_PROVIDER_SITE_OTHER): Payer: Self-pay | Admitting: Obstetrics & Gynecology

## 2015-11-23 ENCOUNTER — Encounter: Payer: Self-pay | Admitting: Obstetrics & Gynecology

## 2015-11-23 VITALS — BP 129/78 | HR 77 | Temp 98.0°F | Wt 150.7 lb

## 2015-11-23 DIAGNOSIS — Z3009 Encounter for other general counseling and advice on contraception: Secondary | ICD-10-CM

## 2015-11-23 NOTE — Progress Notes (Signed)
Patient ID: Len ChildsDominique Osmer, female   DOB: [birth year], 30 y.o.   MRN: [id number] Patient desires surgical management with laparoscopic bilateral tubal ligation.  The risks of surgery were discussed in detail with the patient including but not limited to: bleeding which may require transfusion or reoperation; infection which may require prolonged hospitalization or re-hospitalization and antibiotic therapy; injury to bowel, bladder, ureters and major vessels or other surrounding organs; need for additional procedures including laparotomy; thromboembolic phenomenon, incisional problems and other postoperative or anesthesia complications.  Patient was told that the likelihood that her condition and symptoms will be treated effectively with this surgical management was very high; the postoperative expectations were also discussed in detail. The patient also understands the alternative treatment options which were discussed in full. All questions were answered.  She was told that she will be contacted by our surgical scheduler regarding the time and date of her surgery; routine preoperative instructions of having nothing to eat or drink after midnight on the day prior to surgery and also coming to the hospital 1 1/2 hours prior to her time of surgery were also emphasized.  She was told she may be called for a preoperative appointment about a week prior to surgery and will be given further preoperative instructions at that visit. Printed patient education handouts about the procedure were given to the patient to review at home.  Stefan Markarian L. Harraway-Smith, M.D., Evern CoreFACOG

## 2015-11-23 NOTE — Patient Instructions (Signed)
Laparoscopic Tubal Ligation Laparoscopic tubal ligation is a procedure that closes the fallopian tubes at a time other than right after childbirth. When the fallopian tubes are closed, the eggs that are released from the ovaries cannot enter the uterus, and sperm cannot reach the egg. Tubal ligation is also known as getting your "tubes tied." Tubal ligation is done so you will not be able to get pregnant or have a baby. Although this procedure may be undone (reversed), it should be considered permanent and irreversible. If you want to have future pregnancies, you should not have this procedure. LET YOUR HEALTH CARE PROVIDER KNOW ABOUT:  Any allergies you have.  All medicines you are taking, including vitamins, herbs, eye drops, creams, and over-the-counter medicines. This includes any use of steroids, either by mouth or in cream form.  Previous problems you or members of your family have had with the use of anesthetics.  Any blood disorders you have.  Previous surgeries you have had.  Any medical conditions you may have.  Possibility of pregnancy, if this applies.  Any past pregnancies. RISKS AND COMPLICATIONS  Infection.  Bleeding.  Injury to surrounding organs.  Side effects from anesthetics.  Failure of the procedure.  Ectopic pregnancy.  Future regret about having the procedure done. BEFORE THE PROCEDURE  Ask your health care provider about:  Changing or stopping your regular medicines. This is especially important if you are taking diabetes medicines or blood thinners.  Taking medicines such as aspirin and ibuprofen. These medicines can thin your blood. Do not take these medicines before your procedure if your health care provider instructs you not to.  Follow instructions from your health care provider about eating and drinking restrictions.  Plan to have someone take you home after the procedure.  If you go home right after the procedure, plan to have someone  with you for 24 hours. PROCEDURE  You will be given one or more of the following:  A medicine that helps you relax (sedative).  A medicine that numbs the area (local anesthetic).  A medicine that makes you fall asleep (general anesthetic).  A medicine that is injected into an area of your body that numbs everything below the injection site (regional anesthetic).  If you have been given general anesthetic, a tube will be put down your throat to help you breathe.  Two small cuts (incisions) will be made in the lower abdominal area and near the belly button.  Your bladder may be emptied with a small tube (catheter).  Your abdomen will be inflated with a safe gas (carbon dioxide). This will help to give the surgeon room to operate and visualize, and it will help the surgeon to avoid other organs.  A thin, lighted tube (laparoscope) with a camera attached will be inserted into your abdomen through one of the incisions near the belly button. Other small instruments will be inserted through the other abdominal incision.  The fallopian tubes will be tied off or burned (cauterized), or they will be blocked with a clip, ring, or clamp. In many cases, a small portion in the center of each fallopian tube will also be removed.  After the fallopian tubes are blocked, the gas will be released from the abdomen.  The incisions will be closed with stitches (sutures).  A bandage (dressing) will be placed over the incisions. The procedure may vary among health care providers and hospitals. AFTER THE PROCEDURE  Your blood pressure, heart rate, breathing rate, and blood oxygen level   will be monitored often until the medicines you were given have worn off.  You will be given pain medicine as needed.  If you had general anesthetic, you may have some mild discomfort in your throat. This is from the breathing tube that was placed in your throat while you were sleeping.  You may experience discomfort in  the shoulder area from some trapped air between your liver and your diaphragm. This sensation is normal, and it will slowly go away on its own.  You will have some mild abdominal discomfort for 3--7 days.   This information is not intended to replace advice given to you by your health care provider. Make sure you discuss any questions you have with your health care provider.   Document Released: 11/04/2000 Document Revised: 12/13/2014 Document Reviewed: 11/09/2011 Elsevier Interactive Patient Education 2016 Elsevier Inc.  

## 2015-12-06 ENCOUNTER — Encounter (HOSPITAL_COMMUNITY): Payer: Self-pay | Admitting: *Deleted

## 2015-12-19 ENCOUNTER — Ambulatory Visit (HOSPITAL_COMMUNITY): Payer: Medicaid Other | Admitting: Anesthesiology

## 2015-12-19 ENCOUNTER — Encounter (HOSPITAL_COMMUNITY): Payer: Self-pay | Admitting: Obstetrics & Gynecology

## 2015-12-19 ENCOUNTER — Encounter (HOSPITAL_COMMUNITY)
Admission: RE | Disposition: A | Discharge status: Home / Self Care | Payer: Self-pay | Source: Ambulatory Visit | Attending: Obstetrics & Gynecology

## 2015-12-19 ENCOUNTER — Encounter (HOSPITAL_COMMUNITY): Payer: Self-pay | Admitting: *Deleted

## 2015-12-19 ENCOUNTER — Ambulatory Visit (HOSPITAL_COMMUNITY)
Admission: RE | Admit: 2015-12-19 | Discharge: 2015-12-19 | Disposition: A | Discharge status: Home / Self Care | Payer: Medicaid Other | Source: Ambulatory Visit | Attending: Obstetrics & Gynecology | Admitting: Obstetrics & Gynecology

## 2015-12-19 DIAGNOSIS — Z87891 Personal history of nicotine dependence: Secondary | ICD-10-CM | POA: Diagnosis not present

## 2015-12-19 DIAGNOSIS — K219 Gastro-esophageal reflux disease without esophagitis: Secondary | ICD-10-CM | POA: Insufficient documentation

## 2015-12-19 DIAGNOSIS — Z302 Encounter for sterilization: Secondary | ICD-10-CM | POA: Diagnosis present

## 2015-12-19 HISTORY — PX: LAPAROSCOPIC TUBAL LIGATION: SHX1937

## 2015-12-19 LAB — CBC
HCT: 36.2 % (ref 36.0–46.0)
Hemoglobin: 11.9 g/dL — ABNORMAL LOW (ref 12.0–15.0)
MCH: 29.8 pg (ref 26.0–34.0)
MCHC: 32.9 g/dL (ref 30.0–36.0)
MCV: 90.7 fL (ref 78.0–100.0)
Platelets: 383 10*3/uL (ref 150–400)
RBC: 3.99 MIL/uL (ref 3.87–5.11)
RDW: 12.9 % (ref 11.5–15.5)
WBC: 8.2 10*3/uL (ref 4.0–10.5)

## 2015-12-19 LAB — PREGNANCY, URINE: PREG TEST UR: NEGATIVE

## 2015-12-19 SURGERY — LIGATION, FALLOPIAN TUBE, LAPAROSCOPIC
Anesthesia: General | Site: Abdomen | Laterality: Bilateral

## 2015-12-19 MED ORDER — ONDANSETRON HCL 4 MG/2ML IJ SOLN
INTRAMUSCULAR | Status: AC
  Filled 2015-12-19: qty 2

## 2015-12-19 MED ORDER — ROCURONIUM BROMIDE 100 MG/10ML IV SOLN
INTRAVENOUS | Status: AC
  Filled 2015-12-19: qty 1

## 2015-12-19 MED ORDER — NEOSTIGMINE METHYLSULFATE 10 MG/10ML IV SOLN
INTRAVENOUS | Status: DC | PRN
  Administered 2015-12-19: 2 mg via INTRAVENOUS

## 2015-12-19 MED ORDER — LACTATED RINGERS IV SOLN
INTRAVENOUS | Status: DC
  Administered 2015-12-19 (×2): via INTRAVENOUS

## 2015-12-19 MED ORDER — OXYCODONE HCL 5 MG PO TABS
5.0000 mg | ORAL_TABLET | Freq: Once | ORAL | Status: AC | PRN
  Administered 2015-12-19: 5 mg via ORAL

## 2015-12-19 MED ORDER — NEOSTIGMINE METHYLSULFATE 10 MG/10ML IV SOLN
INTRAVENOUS | Status: AC
  Filled 2015-12-19: qty 1

## 2015-12-19 MED ORDER — LIDOCAINE HCL (CARDIAC) 20 MG/ML IV SOLN
INTRAVENOUS | Status: AC
  Filled 2015-12-19: qty 5

## 2015-12-19 MED ORDER — GLYCOPYRROLATE 0.2 MG/ML IJ SOLN
INTRAMUSCULAR | Status: DC | PRN
  Administered 2015-12-19: 0.1 mg via INTRAVENOUS
  Administered 2015-12-19: 0.4 mg via INTRAVENOUS

## 2015-12-19 MED ORDER — PROPOFOL 10 MG/ML IV BOLUS
INTRAVENOUS | Status: DC | PRN
  Administered 2015-12-19: 50 mg via INTRAVENOUS
  Administered 2015-12-19: 150 mg via INTRAVENOUS

## 2015-12-19 MED ORDER — OXYCODONE HCL 5 MG/5ML PO SOLN: 5.0000 mg | Freq: Once | ORAL | Status: AC | PRN

## 2015-12-19 MED ORDER — IBUPROFEN 600 MG PO TABS: 600.0000 mg | ORAL_TABLET | Freq: Four times a day (QID) | ORAL | Status: DC | PRN

## 2015-12-19 MED ORDER — LACTATED RINGERS IV SOLN
INTRAVENOUS | Status: DC
  Administered 2015-12-19: 125 mL/h via INTRAVENOUS

## 2015-12-19 MED ORDER — FENTANYL CITRATE (PF) 100 MCG/2ML IJ SOLN
INTRAMUSCULAR | Status: AC
  Administered 2015-12-19: 50 ug via INTRAVENOUS
  Filled 2015-12-19: qty 2

## 2015-12-19 MED ORDER — SCOPOLAMINE 1 MG/3DAYS TD PT72
1.0000 | MEDICATED_PATCH | Freq: Once | TRANSDERMAL | Status: DC
  Administered 2015-12-19: 1.5 mg via TRANSDERMAL

## 2015-12-19 MED ORDER — KETOROLAC TROMETHAMINE 30 MG/ML IJ SOLN
INTRAMUSCULAR | Status: DC | PRN
  Administered 2015-12-19: 30 mg via INTRAVENOUS

## 2015-12-19 MED ORDER — BUPIVACAINE HCL 0.5 % IJ SOLN
INTRAMUSCULAR | Status: DC | PRN
  Administered 2015-12-19: 20 mL

## 2015-12-19 MED ORDER — DEXAMETHASONE SODIUM PHOSPHATE 10 MG/ML IJ SOLN
INTRAMUSCULAR | Status: AC
  Filled 2015-12-19: qty 1

## 2015-12-19 MED ORDER — OXYCODONE-ACETAMINOPHEN 5-325 MG PO TABS: 1.0000 | ORAL_TABLET | Freq: Four times a day (QID) | ORAL | Status: DC | PRN

## 2015-12-19 MED ORDER — ONDANSETRON HCL 4 MG/2ML IJ SOLN
INTRAMUSCULAR | Status: DC | PRN
  Administered 2015-12-19: 4 mg via INTRAVENOUS

## 2015-12-19 MED ORDER — ONDANSETRON HCL 4 MG/2ML IJ SOLN: 4.0000 mg | Freq: Once | INTRAMUSCULAR | Status: DC | PRN

## 2015-12-19 MED ORDER — MIDAZOLAM HCL 2 MG/2ML IJ SOLN
INTRAMUSCULAR | Status: AC
  Filled 2015-12-19: qty 2

## 2015-12-19 MED ORDER — FENTANYL CITRATE (PF) 100 MCG/2ML IJ SOLN
25.0000 ug | INTRAMUSCULAR | Status: DC | PRN
  Administered 2015-12-19 (×3): 50 ug via INTRAVENOUS

## 2015-12-19 MED ORDER — ROCURONIUM BROMIDE 100 MG/10ML IV SOLN
INTRAVENOUS | Status: DC | PRN
  Administered 2015-12-19: 25 mg via INTRAVENOUS

## 2015-12-19 MED ORDER — SCOPOLAMINE 1 MG/3DAYS TD PT72
MEDICATED_PATCH | TRANSDERMAL | Status: AC
  Filled 2015-12-19: qty 1

## 2015-12-19 MED ORDER — FENTANYL CITRATE (PF) 250 MCG/5ML IJ SOLN
INTRAMUSCULAR | Status: DC | PRN
  Administered 2015-12-19: 100 ug via INTRAVENOUS
  Administered 2015-12-19: 50 ug via INTRAVENOUS
  Administered 2015-12-19: 100 ug via INTRAVENOUS

## 2015-12-19 MED ORDER — OXYCODONE HCL 5 MG PO TABS
ORAL_TABLET | ORAL | Status: AC
  Filled 2015-12-19: qty 1

## 2015-12-19 MED ORDER — LIDOCAINE HCL (CARDIAC) 20 MG/ML IV SOLN
INTRAVENOUS | Status: DC | PRN
  Administered 2015-12-19: 100 mg via INTRAVENOUS

## 2015-12-19 MED ORDER — FENTANYL CITRATE (PF) 100 MCG/2ML IJ SOLN
INTRAMUSCULAR | Status: AC
  Filled 2015-12-19: qty 2

## 2015-12-19 MED ORDER — DEXAMETHASONE SODIUM PHOSPHATE 4 MG/ML IJ SOLN
INTRAMUSCULAR | Status: DC | PRN
  Administered 2015-12-19: 4 mg via INTRAVENOUS

## 2015-12-19 MED ORDER — BUPIVACAINE HCL (PF) 0.5 % IJ SOLN
INTRAMUSCULAR | Status: AC
  Filled 2015-12-19: qty 30

## 2015-12-19 MED ORDER — MIDAZOLAM HCL 2 MG/2ML IJ SOLN
INTRAMUSCULAR | Status: DC | PRN
  Administered 2015-12-19: 2 mg via INTRAVENOUS

## 2015-12-19 MED ORDER — FENTANYL CITRATE (PF) 250 MCG/5ML IJ SOLN
INTRAMUSCULAR | Status: AC
  Filled 2015-12-19: qty 5

## 2015-12-19 MED ORDER — PROPOFOL 10 MG/ML IV BOLUS
INTRAVENOUS | Status: AC
  Filled 2015-12-19: qty 20

## 2015-12-19 MED ORDER — KETOROLAC TROMETHAMINE 30 MG/ML IJ SOLN
INTRAMUSCULAR | Status: AC
  Filled 2015-12-19: qty 1

## 2015-12-19 SURGICAL SUPPLY — 23 items
CATH ROBINSON RED A/P 16FR (CATHETERS) IMPLANT
CHLORAPREP W/TINT 26ML (MISCELLANEOUS) ×3 IMPLANT
CLIP FILSHIE TUBAL LIGA STRL (Clip) ×3 IMPLANT
CLOTH BEACON ORANGE TIMEOUT ST (SAFETY) ×3 IMPLANT
DRSG COVADERM PLUS 2X2 (GAUZE/BANDAGES/DRESSINGS) IMPLANT
DRSG OPSITE POSTOP 3X4 (GAUZE/BANDAGES/DRESSINGS) ×3 IMPLANT
GLOVE BIO SURGEON STRL SZ7 (GLOVE) ×3 IMPLANT
GLOVE BIOGEL PI IND STRL 7.0 (GLOVE) ×4 IMPLANT
GLOVE BIOGEL PI INDICATOR 7.0 (GLOVE) ×8
GOWN STRL REUS W/TWL LRG LVL3 (GOWN DISPOSABLE) ×6 IMPLANT
GOWN STRL REUS W/TWL XL LVL3 (GOWN DISPOSABLE) ×3 IMPLANT
MANIPULATOR UTERINE 4.5 ZUMI (MISCELLANEOUS) ×3 IMPLANT
NEEDLE INSUFFLATION 120MM (ENDOMECHANICALS) ×3 IMPLANT
PACK LAPAROSCOPY BASIN (CUSTOM PROCEDURE TRAY) ×3 IMPLANT
PAD TRENDELENBURG POSITION (MISCELLANEOUS) ×3 IMPLANT
SLEEVE XCEL OPT CAN 5 100 (ENDOMECHANICALS) IMPLANT
SUT VIC AB 3-0 PS2 18 (SUTURE) ×3 IMPLANT
SUT VICRYL 0 UR6 27IN ABS (SUTURE) ×3 IMPLANT
TOWEL OR 17X24 6PK STRL BLUE (TOWEL DISPOSABLE) ×6 IMPLANT
TROCAR XCEL DIL TIP R 11M (ENDOMECHANICALS) ×3 IMPLANT
TROCAR XCEL NON-BLD 5MMX100MML (ENDOMECHANICALS) IMPLANT
WARMER LAPAROSCOPE (MISCELLANEOUS) ×3 IMPLANT
WATER STERILE IRR 1000ML POUR (IV SOLUTION) ×3 IMPLANT

## 2015-12-19 NOTE — Anesthesia Procedure Notes (Signed)
Procedure Name: Intubation Date/Time: 12/19/2015 11:24 AM Performed by: Graciela HusbandsFUSSELL, Becca Bayne O Pre-anesthesia Checklist: Suction available, Emergency Drugs available, Timeout performed, Patient being monitored and Patient identified Patient Re-evaluated:Patient Re-evaluated prior to inductionOxygen Delivery Method: Circle system utilized Preoxygenation: Pre-oxygenation with 100% oxygen Intubation Type: IV induction Ventilation: Mask ventilation without difficulty Laryngoscope Size: Mac and 3 Grade View: Grade I Tube type: Oral Tube size: 7.0 mm Number of attempts: 1 Airway Equipment and Method: Stylet Placement Confirmation: ETT inserted through vocal cords under direct vision,  breath sounds checked- equal and bilateral and positive ETCO2 Secured at: 21 cm Tube secured with: Tape Dental Injury: Teeth and Oropharynx as per pre-operative assessment

## 2015-12-19 NOTE — Brief Op Note (Signed)
12/19/2015  12:13 PM  PATIENT:  [name]  30 y.o. female  PRE-OPERATIVE DIAGNOSIS:   Undesired fertility  POST-OPERATIVE DIAGNOSIS:   Undesired fertility  PROCEDURE:  Procedure(s): LAPAROSCOPIC TUBAL LIGATION with Filshe Clips (Bilateral)  SURGEON:  Surgeon(s) and Role:    * Willodean Rosenthalarolyn Harraway-Smith, MD - Primary  ANESTHESIA:   general  EBL:  Total I/O In: 1600 [I.V.:1600] Out: 1 [Blood:1]  BLOOD ADMINISTERED:none  DRAINS: none   LOCAL MEDICATIONS USED:  MARCAINE     SPECIMEN:  No Specimen  DISPOSITION OF SPECIMEN:  N/A  COUNTS:  YES  TOURNIQUET:  * No tourniquets in log *  DICTATION: .Note written in EPIC  PLAN OF CARE: Discharge to home after PACU  PATIENT DISPOSITION:  PACU - hemodynamically stable.   Delay start of Pharmacological VTE agent (>24hrs) due to surgical blood loss or risk of bleeding: not applicable  Complications: none immediate  Fumi Guadron L. Harraway-Smith, M.D., Evern CoreFACOG

## 2015-12-19 NOTE — Discharge Instructions (Signed)
Laparoscopic Tubal Ligation, Care After °Refer to this sheet in the next few weeks. These instructions provide you with information about caring for yourself after your procedure. Your health care provider may also give you more specific instructions. Your treatment has been planned according to current medical practices, but problems sometimes occur. Call your health care provider if you have any problems or questions after your procedure. °WHAT TO EXPECT AFTER THE PROCEDURE °After your procedure, it is common to have: °· Sore throat. °· Soreness at the incision site. °· Mild cramping. °· Tiredness. °· Mild nausea or vomiting. °· Shoulder pain. °HOME CARE INSTRUCTIONS °· Rest for the remainder of the day. °· Take medicines only as directed by your health care provider. These include over-the-counter medicines and prescription medicines. Do not take aspirin, which can cause bleeding. °· Over the next few days, gradually return to your normal activities and your normal diet. °· Avoid sexual intercourse for 2 weeks or as directed by your health care provider. °· Do not use tampons, and do not douche. °· Do not drive or operate heavy machinery while taking pain medicine. °· Do not lift anything that is heavier than 5 lb (2.3 kg) for 2 weeks or as directed by your health care provider. °· Do not take baths. Take showers only. Ask your health care provider when you can start taking baths. °· Take your temperature twice each day and write it down. °· Try to have help for your household needs for the first 7-10 days. °· There are many different ways to close and cover an incision, including stitches (sutures), skin glue, and adhesive strips. Follow instructions from your health care provider about: °¨ Incision care. °¨ Bandage (dressing) changes and removal. °¨ Incision closure removal. °· Check your incision area every day for signs of infection. Watch for: °¨ Redness, swelling, or pain. °¨ Fluid, blood, or pus. °· Keep  all follow-up visits as directed by your health care provider. °SEEK MEDICAL CARE IF: °· You have redness, swelling, or increasing pain in your incision area. °· You have fluid, blood, or pus coming from your incision for longer than 1 day. °· You notice a bad smell coming from your incision or your dressing. °· The edges of your incision break open after the sutures have been removed. °· Your pain does not decrease after 2-3 days. °· You have a rash. °· You repeatedly become dizzy or light-headed. °· You have a reaction to your medicine. °· Your pain medicine is not helping. °· You are constipated. °SEEK IMMEDIATE MEDICAL CARE IF: °· You have a fever. °· You faint. °· You have increasing pain in your abdomen. °· You have severe pain in one or both of your shoulders. °· You have bleeding or drainage from your suture sites or your vagina after surgery. °· You have shortness of breath or have difficulty breathing. °· You have chest pain or leg pain. °· You have ongoing nausea, vomiting, or diarrhea. °  °This information is not intended to replace advice given to you by your health care provider. Make sure you discuss any questions you have with your health care provider. °  °Document Released: 02/15/2005 Document Revised: 12/13/2014 Document Reviewed: 11/09/2011 °Elsevier Interactive Patient Education ©2016 Elsevier Inc. ° ° °Post Anesthesia Home Care Instructions ° °Activity: °Get plenty of rest for the remainder of the day. A responsible adult should stay with you for 24 hours following the procedure.  °For the next 24 hours, DO NOT: °-Drive   a car °-Operate machinery °-Drink alcoholic beverages °-Take any medication unless instructed by your physician °-Make any legal decisions or sign important papers. ° °Meals: °Start with liquid foods such as gelatin or soup. Progress to regular foods as tolerated. Avoid greasy, spicy, heavy foods. If nausea and/or vomiting occur, drink only clear liquids until the nausea and/or  vomiting subsides. Call your physician if vomiting continues. ° °Special Instructions/Symptoms: °Your throat may feel dry or sore from the anesthesia or the breathing tube placed in your throat during surgery. If this causes discomfort, gargle with warm salt water. The discomfort should disappear within 24 hours. ° °If you had a scopolamine patch placed behind your ear for the management of post- operative nausea and/or vomiting: ° °1. The medication in the patch is effective for 72 hours, after which it should be removed.  Wrap patch in a tissue and discard in the trash. Wash hands thoroughly with soap and water. °2. You may remove the patch earlier than 72 hours if you experience unpleasant side effects which may include dry mouth, dizziness or visual disturbances. °3. Avoid touching the patch. Wash your hands with soap and water after contact with the patch. ° ° °

## 2015-12-19 NOTE — H&P (Signed)
Preoperative History and Physical  Shelley ChildsDominique Kevorkian is a 30 y.o. [id number] here for surgical management of sterilization.   Proposed surgery: laparoscopic bilateral salpingectomy  Past Medical History  Diagnosis Date  . Preterm labor   . Anemia   . SVD (spontaneous vaginal delivery)     x 5  . GERD (gastroesophageal reflux disease)     with pregnancy only  - no med   Past Surgical History  Procedure Laterality Date  . Wisdom tooth extraction      ALL 4   OB History    Gravida Para Term Preterm AB TAB SAB Ectopic Multiple Living   5 5 2 3      0 5     Patient denies any cervical dysplasia or STIs. Prescriptions prior to admission  Medication Sig Dispense Refill Last Dose  . ibuprofen (ADVIL,MOTRIN) 600 MG tablet Take 1 tablet (600 mg total) by mouth every 6 (six) hours. (Patient taking differently: Take 600 mg by mouth every 6 (six) hours as needed for moderate pain. ) 60 tablet 1 Past Week at Unknown time    No Known Allergies Social History:   reports that she quit smoking about 9 months ago. Her smoking use included Cigarettes. She smoked 0.25 packs per day. She has never used smokeless tobacco. She reports that she does not drink alcohol or use illicit drugs. Family History  Problem Relation Age of Onset  . Diabetes Maternal Aunt   . Diabetes Maternal Grandmother     Review of Systems: Noncontributory  PHYSICAL EXAM: Blood pressure 119/82, pulse 81, temperature 98.2 F (36.8 C), temperature source Oral, resp. rate 18, height 5' (1.524 m), weight 145 lb (65.772 kg), SpO2 100 %, not currently breastfeeding. General appearance - alert, well appearing, and in no distress Chest - clear to auscultation, no wheezes, rales or rhonchi, symmetric air entry Heart - normal rate and regular rhythm Abdomen - soft, nontender, nondistended, no masses or organomegaly Pelvic - examination not indicated Extremities - peripheral pulses normal, no pedal edema, no clubbing or  cyanosis  Labs: Results for orders placed or performed during the hospital encounter of 12/19/15 (from the past 336 hour(s))  CBC   Collection Time: 12/19/15  8:40 AM  Result Value Ref Range   WBC 8.2 4.0 - 10.5 K/uL   RBC 3.99 3.87 - 5.11 MIL/uL   Hemoglobin 11.9 (L) 12.0 - 15.0 g/dL   HCT 14.736.2 82.936.0 - 56.246.0 %   MCV 90.7 78.0 - 100.0 fL   MCH 29.8 26.0 - 34.0 pg   MCHC 32.9 30.0 - 36.0 g/dL   RDW 13.012.9 86.511.5 - 78.415.5 %   Platelets 383 150 - 400 K/uL  Pregnancy, urine   Collection Time: 12/19/15  8:45 AM  Result Value Ref Range   Preg Test, Ur NEGATIVE NEGATIVE    Imaging Studies: No results found.  Assessment: Patient Active Problem List   Diagnosis Date Noted  . Encounter for female sterilization procedure 12/19/2015  . Preterm delivery, delivered 11/06/2015    Plan: Patient will undergo surgical management with laparoscopic bilateral salpingectomy.   The risks of surgery were discussed in detail with the patient including but not limited to: bleeding which may require transfusion or reoperation; infection which may require antibiotics; injury to surrounding organs which may involve bowel, bladder, ureters ; need for additional procedures including laparoscopy or laparotomy; thromboembolic phenomenon, surgical site problems and other postoperative/anesthesia complications. A failure rate of 3-11/998. Likelihood of success in alleviating the patient's condition  was discussed. Routine postoperative instructions will be reviewed with the patient and her family in detail after surgery.  The patient concurred with the proposed plan, giving informed written consent for the surgery.  Patient has been NPO since last night she will remain NPO for procedure.  Anesthesia and OR aware.  Preoperative prophylactic antibiotics and SCDs ordered on call to the OR.  To OR when ready.  Agapito Hanway L. Harraway-Smith, M.D., [facility] 12/19/2015 9:20 AM

## 2015-12-19 NOTE — Anesthesia Preprocedure Evaluation (Signed)
Anesthesia Evaluation  Patient identified by MRN, date of birth, ID band Patient awake    Reviewed: Allergy & Precautions, NPO status , Patient's Chart, lab work & pertinent test results  Airway Mallampati: II  TM Distance: >3 FB Neck ROM: Full    Dental  (+) Teeth Intact, Dental Advisory Given   Pulmonary former smoker,    breath sounds clear to auscultation       Cardiovascular  Rhythm:Regular Rate:Normal     Neuro/Psych    GI/Hepatic   Endo/Other    Renal/GU      Musculoskeletal   Abdominal   Peds  Hematology   Anesthesia Other Findings   Reproductive/Obstetrics                             Anesthesia Physical Anesthesia Plan  ASA: I  Anesthesia Plan: General   Post-op Pain Management:    Induction: Intravenous  Airway Management Planned: Oral ETT  Additional Equipment:   Intra-op Plan:   Post-operative Plan: Extubation in OR  Informed Consent: I have reviewed the patients History and Physical, chart, labs and discussed the procedure including the risks, benefits and alternatives for the proposed anesthesia with the patient or authorized representative who has indicated his/her understanding and acceptance.   Dental advisory given  Plan Discussed with: CRNA and Anesthesiologist  Anesthesia Plan Comments:         Anesthesia Quick Evaluation  

## 2015-12-19 NOTE — Anesthesia Postprocedure Evaluation (Signed)
Anesthesia Post Note  Patient: Len ChildsDominique Hepler  Procedure(s) Performed: Procedure(s) (LRB): LAPAROSCOPIC TUBAL LIGATION with Filshe Clips (Bilateral)  Patient location during evaluation: PACU Anesthesia Type: General Level of consciousness: awake, awake and alert and oriented Pain management: pain level controlled Vital Signs Assessment: post-procedure vital signs reviewed and stable Respiratory status: spontaneous breathing, nonlabored ventilation and respiratory function stable Cardiovascular status: blood pressure returned to baseline Anesthetic complications: no    Last Vitals:  Filed Vitals:   12/19/15 1202 12/19/15 1215  BP: 153/92   Pulse: 83 79  Temp: 36.8 C   Resp: 16 17    Last Pain:  Filed Vitals:   12/19/15 1217  PainSc: 9                  Briarrose Shor COKER

## 2015-12-19 NOTE — Transfer of Care (Signed)
Immediate Anesthesia Transfer of Care Note  Patient: Len ChildsDominique Brecht  Procedure(s) Performed: Procedure(s): LAPAROSCOPIC TUBAL LIGATION with Anna GenreFilshe Clips (Bilateral)  Patient Location: PACU  Anesthesia Type:General  Level of Consciousness: awake, alert  and oriented  Airway & Oxygen Therapy: Patient Spontanous Breathing and Patient connected to nasal cannula oxygen  Post-op Assessment: Report given to RN and Post -op Vital signs reviewed and stable  Post vital signs: Reviewed and stable  Last Vitals:  Filed Vitals:   12/19/15 0852 12/19/15 1202  BP: 119/82 153/92  Pulse: 81 83  Temp: 36.8 C 36.8 C  Resp: 18 16    Last Pain: There were no vitals filed for this visit.       Complications: No apparent anesthesia complications

## 2015-12-19 NOTE — Anesthesia Postprocedure Evaluation (Signed)
Anesthesia Post Note  Patient: [name]  Procedure(s) Performed: Procedure(s) (LRB): LAPAROSCOPIC TUBAL LIGATION with Filshe Clips (Bilateral)  Patient location during evaluation: PACU Anesthesia Type: General Level of consciousness: awake, awake and alert and oriented Pain management: pain level controlled Vital Signs Assessment: post-procedure vital signs reviewed and stable Respiratory status: patient connected to nasal cannula oxygen, spontaneous breathing, nonlabored ventilation and respiratory function stable Cardiovascular status: blood pressure returned to baseline Anesthetic complications: no    Last Vitals:  Filed Vitals:   12/19/15 1202 12/19/15 1215  BP: 153/92 156/84  Pulse: 83 79  Temp: 36.8 C   Resp: 16 17    Last Pain:  Filed Vitals:   12/19/15 1225  PainSc: 9                  [name]

## 2015-12-19 NOTE — Op Note (Signed)
12/19/2015  12:13 PM  PATIENT:  [name]  30 y.o. female  PRE-OPERATIVE DIAGNOSIS:   Undesired fertility  POST-OPERATIVE DIAGNOSIS:   Undesired fertility  PROCEDURE:  Procedure(s): LAPAROSCOPIC TUBAL LIGATION with Filshe Clips (Bilateral)  SURGEON:  Surgeon(s) and Role:    * Willodean Rosenthalarolyn Harraway-Smith, MD - Primary  ANESTHESIA:   general  EBL:  Total I/O In: 1600 [I.V.:1600] Out: 1 [Blood:1]  BLOOD ADMINISTERED:none  DRAINS: none   LOCAL MEDICATIONS USED:  MARCAINE     SPECIMEN:  No Specimen  DISPOSITION OF SPECIMEN:  N/A  COUNTS:  YES  TOURNIQUET:  * No tourniquets in log *  DICTATION: .Note written in EPIC  PLAN OF CARE: Discharge to home after PACU  PATIENT DISPOSITION:  PACU - hemodynamically stable.   Delay start of Pharmacological VTE agent (>24hrs) due to surgical blood loss or risk of bleeding: not applicable  Complications: none immediate   INDICATIONS: 30 y.o. [id number]  with undesired fertility, desires permanent sterilization. Other reversible forms of contraception were discussed with patient; she declines all other modalities.  Risks of procedure discussed with patient including permanence of method, bleeding, infection, injury to surrounding organs and need for additional procedures including laparotomy, risk of regret.  Failure risk of 0.5-1% with increased risk of ectopic gestation if pregnancy occurs was also discussed with patient.      FINDINGS:  Normal uterus, tubes, and ovaries.  TECHNIQUE:  The patient was taken to the operating room where general anesthesia was obtained without difficulty.  She was then placed in the dorsal lithotomy position and prepared and draped in sterile fashion.  After an adequate timeout was performed, a bivalved speculum was then placed in the patient's vagina, and the anterior lip of cervix grasped with the single-tooth tenaculum.  The uterine manipulator was then advanced into the uterus.  The speculum was  removed from the vagina.  Attention was then turned to the patient's abdomen where a 11-mm skin incision was made in the umbilical fold.  The Veress needle was carefully introduced into the peritoneal cavity through the abdominal wall.  Intraperitoneal placement was confirmed by drop in intraabdominal pressure with insufflation of carbon dioxide gas.  Adequate pneumoperitoneum was obtained, and the 11-mm trocar and sleeve were then advanced without difficulty into the abdomen where intraabdominal placement was confirmed by the operative laparoscope. A survey of the patient's pelvis and abdomen revealed entirely normal anatomy.  The fallopian tubes were observed and found to be normal in appearance. The Filshie clip applicator was placed through the operative port, and a Filshie clip was placed on the right fallopian tube ,about 2 cm from the cornual attachment, with care given to incorporate the underlying mesosalpinx.  A similar process was carried out on the contralateral side allowing for bilateral tubal sterilization.   Good hemostasis was noted overall.  The instruments were then removed from the patient's abdomen and the fascial incision was repaired with 0 Vicryl, and the skin was closed with Dermabond.  The uterine manipulator and the tenaculum were removed from the vagina without complications. The patient tolerated the procedure well.  Sponge, lap, and needle counts were correct times two.  The patient was then taken to the recovery room awake, extubated and in stable condition.  Lora Chavers L. Harraway-Smith, M.D., [facility]

## 2015-12-20 ENCOUNTER — Encounter (HOSPITAL_COMMUNITY): Payer: Self-pay | Admitting: Obstetrics & Gynecology

## 2015-12-21 ENCOUNTER — Encounter: Payer: Self-pay | Admitting: Family Medicine

## 2015-12-21 ENCOUNTER — Ambulatory Visit (INDEPENDENT_AMBULATORY_CARE_PROVIDER_SITE_OTHER): Payer: Medicaid Other | Admitting: Family Medicine

## 2015-12-21 NOTE — Progress Notes (Signed)
Patient ID: [name], female   DOB: [birth year], 30 y.o.   MRN: [id number] Postpartum Depression Screening is not available in the patients chart... Upon asking question patient would have scored a 0 on the depression screen.

## 2015-12-21 NOTE — Progress Notes (Signed)
Patient ID: [name], female   DOB: [birth year], 30 y.o.   MRN: [id number] Subjective:     [name] is a 30 y.o. female who presents for a postpartum visit. She is six weeks postpartum following a vaginal delivery on 11/06/2015. I have fully reviewed the prenatal and intrapartum course. The delivery was at 25 gestational weeks. Outcome: vaginal delivery. Anesthesia: Epidural.  Postpartum course has been uncomplicated. Baby's course has been uncomplicated. Baby is feeding by breast/bottle. Bleeding small amount. Bowel function is normal. Bladder function is normal. Patient is sexually active. Contraception method is tubal ligation. Postpartum depression screening: negative.  The following portions of the patient's history were reviewed and updated as appropriate: allergies, current medications, past family history, past medical history, past social history, past surgical history and problem list.  Review of Systems Pertinent items are noted in HPI.   Objective:    BP 116/63 mmHg  Pulse 74  Wt 143 lb 1.6 oz (64.91 kg)  General:  alert, cooperative and no distress  Lungs: clear to auscultation bilaterally  Heart:  regular rate and rhythm, S1, S2 normal, no murmur, click, rub or gallop  Abdomen: soft, non-tender; bowel sounds normal; no masses,  no organomegaly   Vulva:  normal  Vagina: normal vagina, no discharge, exudate, lesion, or erythema  Cervix:  cervical motion tenderness        Assessment:     Normal postpartum exam. Pap smear not done at today's visit.   Plan:    1. Contraception: tubal ligation 2. Follow up in: 1 Year or as needed.

## 2015-12-22 NOTE — Addendum Note (Signed)
Addendum  created 12/22/15 1023 by Kipp Broodavid Aela Bohan, MD   Modules edited: Notes Section   Notes Section:  File: [id number]

## 2016-01-17 ENCOUNTER — Ambulatory Visit: Payer: Medicaid Other | Admitting: Obstetrics & Gynecology

## 2016-10-21 ENCOUNTER — Telehealth: Payer: Self-pay

## 2016-10-21 NOTE — Telephone Encounter (Signed)
fmla paper work fill out

## 2016-11-11 ENCOUNTER — Ambulatory Visit: Payer: Medicaid Other | Admitting: Family Medicine

## 2017-03-03 ENCOUNTER — Ambulatory Visit: Payer: Medicaid Other | Admitting: Family Medicine

## 2017-03-17 ENCOUNTER — Ambulatory Visit: Payer: Medicaid Other | Admitting: Obstetrics and Gynecology

## 2017-05-01 ENCOUNTER — Telehealth: Payer: Self-pay | Admitting: General Practice

## 2017-05-01 ENCOUNTER — Ambulatory Visit: Payer: Medicaid Other | Admitting: Obstetrics & Gynecology

## 2017-05-01 NOTE — Telephone Encounter (Signed)
Patient no showed for appt today with Dr Erin Fulling. Called patient and she states that she thought she had an appt tomorrow at 1pm not today. Patient requests appt be rescheduled. Told patient I will let our front office staff know and they will contact her to reschedule. Patient verbalized understanding & had no questions

## 2017-05-05 ENCOUNTER — Telehealth: Payer: Self-pay | Admitting: General Practice

## 2017-05-05 NOTE — Telephone Encounter (Signed)
Called and notified patient of follow up appointment with Dr. Erin Fulling on 06/11/17 at 8:00am.  Patient voiced understanding.

## 2017-06-11 ENCOUNTER — Ambulatory Visit (INDEPENDENT_AMBULATORY_CARE_PROVIDER_SITE_OTHER): Payer: Medicaid Other | Admitting: Obstetrics & Gynecology

## 2017-06-11 ENCOUNTER — Other Ambulatory Visit (HOSPITAL_COMMUNITY)
Admission: RE | Admit: 2017-06-11 | Discharge: 2017-06-11 | Disposition: A | Discharge status: Home / Self Care | Payer: Medicaid Other | Source: Ambulatory Visit | Attending: Obstetrics & Gynecology | Admitting: Obstetrics & Gynecology

## 2017-06-11 ENCOUNTER — Encounter: Payer: Self-pay | Admitting: Obstetrics & Gynecology

## 2017-06-11 ENCOUNTER — Ambulatory Visit: Payer: Self-pay | Admitting: Medical

## 2017-06-11 VITALS — BP 104/62 | HR 78 | Wt 133.8 lb

## 2017-06-11 DIAGNOSIS — Z01419 Encounter for gynecological examination (general) (routine) without abnormal findings: Secondary | ICD-10-CM

## 2017-06-11 DIAGNOSIS — Z3049 Encounter for surveillance of other contraceptives: Secondary | ICD-10-CM | POA: Diagnosis present

## 2017-06-11 DIAGNOSIS — Z1151 Encounter for screening for human papillomavirus (HPV): Secondary | ICD-10-CM | POA: Insufficient documentation

## 2017-06-11 NOTE — Progress Notes (Signed)
Subjective:     Shelley ChildsDominique Ayala is a 31 y.o. female here for a routine exam.  Current complaints: pt reports that her cycles is a bit heavier since her BTL She denies other problems. [facility]     Gynecologic History Patient's last menstrual period was 05/13/2017 (within days). Contraception: tubal ligation Last Pap: >1 year prev. Results were: normal Last mammogram: n/a   Obstetric History OB History  Gravida Para Term Preterm AB Living  5 5 2 3   5   SAB TAB Ectopic Multiple Live Births        0 5    # Outcome Date GA Lbr Shelley/2nd Weight Sex Delivery Anes PTL Lv  5 Preterm 11/06/15 6028w0d 06:44 / 00:09 3 lb 3.5 oz (1.46 kg) F Vag-Spont None  LIV  4 Preterm 01/09/14 8775w4d 06:46 / 00:04 5 lb 1.3 oz (2.305 kg) F Vag-Spont None  LIV  3 Preterm 11/23/10 6033w0d  4 lb 3 oz (1.899 kg) M Vag-Spont None Y LIV     Birth Comments: Labor > SROM  2 Term 11/28/08 2765w0d  5 lb 2 oz (2.325 kg) F Vag-Spont None  LIV     Birth Comments: No complications  1 Term 01/30/00 2232w0d  6 lb 2 oz (2.778 kg) M Vag-Spont None  LIV     Birth Comments: No complications       The following portions of the patient's history were reviewed and updated as appropriate: allergies, current medications, past family history, past medical history, past social history, past surgical history and problem list.  Review of Systems Pertinent items are noted in HPI.    Objective:  BP 104/62   Pulse 78   Wt 133 lb 12.8 oz (60.7 kg)   LMP 05/13/2017 (Within Days)   Breastfeeding? Unknown   BMI 26.13 kg/m  General Appearance:    Alert, cooperative, no distress, appears stated age  Head:    Normocephalic, without obvious abnormality, atraumatic  Eyes:    conjunctiva/corneas clear, EOM's intact, both eyes  Ears:    Normal external ear canals, both ears  Nose:   Nares normal, septum midline, mucosa normal, no drainage    or sinus tenderness  Throat:   Lips, mucosa, and tongue normal; teeth and gums  normal  Neck:   Supple, symmetrical, trachea midline, no adenopathy;    thyroid:  no enlargement/tenderness/nodules  Back:     Symmetric, no curvature, ROM normal, no CVA tenderness  Lungs:     Clear to auscultation bilaterally, respirations unlabored  Chest Wall:    No tenderness or deformity   Heart:    Regular rate and rhythm, S1 and S2 normal, no murmur, rub   or gallop  Breast Exam:    No tenderness, masses, or nipple abnormality  Abdomen:     Soft, non-tender, bowel sounds active all four quadrants,    no masses, no organomegaly  Genitalia:    Normal female without lesion, discharge or tenderness     Extremities:   Extremities normal, atraumatic, no cyanosis or edema  Pulses:   2+ and symmetric all extremities  Skin:   Skin color, texture, turgor normal, no rashes or lesions     Assessment:    Healthy female exam.    Plan:    Follow up in: 1 year.   f/u sooner prn H/o BTL  [name], M.D., [facility]

## 2017-06-11 NOTE — Addendum Note (Signed)
Addended by: Garret ReddishBARNES, Halei Hanover M on: 06/11/2017 11:07 AM   Modules accepted: Orders

## 2017-06-16 LAB — CYTOLOGY - PAP
DIAGNOSIS: NEGATIVE
HPV: NOT DETECTED

## 2017-07-18 ENCOUNTER — Encounter (HOSPITAL_COMMUNITY): Payer: Self-pay | Admitting: Emergency Medicine

## 2017-07-18 ENCOUNTER — Ambulatory Visit (HOSPITAL_COMMUNITY)
Admission: EM | Admit: 2017-07-18 | Discharge: 2017-07-18 | Disposition: A | Discharge status: Home / Self Care | Payer: Self-pay | Attending: Internal Medicine | Admitting: Internal Medicine

## 2017-07-18 DIAGNOSIS — R0789 Other chest pain: Secondary | ICD-10-CM

## 2017-07-18 MED ORDER — GI COCKTAIL ~~LOC~~
30.0000 mL | Freq: Once | ORAL | Status: AC
  Administered 2017-07-18: 30 mL via ORAL

## 2017-07-18 MED ORDER — OMEPRAZOLE 20 MG PO CPDR: 20.0000 mg | DELAYED_RELEASE_CAPSULE | Freq: Every day | ORAL | 0 refills | Status: DC

## 2017-07-18 MED ORDER — GI COCKTAIL ~~LOC~~
ORAL | Status: AC
  Filled 2017-07-18: qty 30

## 2017-07-18 NOTE — ED Triage Notes (Signed)
Pt sts emesis x 1 last night; pt sts cough and pain with cough; pt sts hx of acid reflux

## 2017-07-18 NOTE — ED Provider Notes (Signed)
[facility]    CSN: [id number] Arrival date & time: 07/18/17  1116     History   Chief Complaint Chief Complaint  Patient presents with  . Cough  . Emesis    HPI [name] is a 31 y.o. female history of GERD presenting with 2 days of chest discomfort, vomiting, and cough. Patient has had a chest pain for a few days that she describes as a burning and achy/pulling sensation. Pain with lifting child and heavy boxes at work. Pain is substernal, does not radiate She has also been vomiting after eating. Will take a tums or prilosec for acid. Does not take anything daily. Also trys to drink a lot of water to soothe throat. Denies shortness of breath, radiation of pain. No history of cardiac disease. Mild congestion today.   HPI  Past Medical History:  Diagnosis Date  . Anemia   . GERD (gastroesophageal reflux disease)    with pregnancy only  - no med  . Preterm labor   . SVD (spontaneous vaginal delivery)    x 5    Patient Active Problem List   Diagnosis Date Noted  . Encounter for female sterilization procedure 12/19/2015  . Preterm delivery, delivered 11/06/2015    Past Surgical History:  Procedure Laterality Date  . LAPAROSCOPIC TUBAL LIGATION Bilateral 12/19/2015   Procedure: LAPAROSCOPIC TUBAL LIGATION with Anna GenreFilshe Clips;  Surgeon: Willodean Rosenthalarolyn Harraway-Smith, MD;  Location: [facility];  Service: Gynecology;  Laterality: Bilateral;  . WISDOM TOOTH EXTRACTION     ALL 4    OB History    Gravida Para Term Preterm AB Living   5 5 2 3   5    SAB TAB Ectopic Multiple Live Births         0 5       Home Medications    Prior to Admission medications   Medication Sig Start Date End Date Taking? Authorizing Provider  omeprazole (PRILOSEC) 20 MG capsule Take 1 capsule (20 mg total) by mouth daily. 07/18/17 08/17/17  Anilah Huck, Junius CreamerHallie C, PA-C    Family History Family History  Problem Relation Age of Onset  . Diabetes Maternal Aunt   . Diabetes Maternal  Grandmother   DM and HTN in Maternal Grandmother.  Social History Social History   Tobacco Use  . Smoking status: Former Smoker    Packs/day: 0.25    Types: Cigarettes    Last attempt to quit: 03/07/2015    Years since quitting: 2.3  . Smokeless tobacco: Never Used  Substance Use Topics  . Alcohol use: No  . 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   Allergies   Patient has no known allergies.   Review of Systems Review of Systems  Constitutional: Negative for chills, diaphoresis, fatigue and fever.  HENT: Positive for congestion and sore throat. Negative for trouble swallowing and voice change.   Respiratory: Negative for cough and shortness of breath.   Cardiovascular: Positive for chest pain. Negative for palpitations and leg swelling.  Gastrointestinal: Positive for nausea and vomiting.  Musculoskeletal: Negative for arthralgias and back pain.  Skin: Negative for color change and rash.  Neurological: Negative for dizziness, syncope, weakness, light-headedness and headaches.  All other systems reviewed and are negative.    Physical Exam Triage Vital Signs ED Triage Vitals [07/18/17 1130]  Enc Vitals Group     BP (!) 121/57     Pulse Rate  87     Resp 18     Temp 98.3 F (36.8 C)     Temp Source Oral     SpO2 100 %     Weight      Height      Head Circumference      Peak Flow      Pain Score 8     Pain Loc      Pain Edu?      Excl. in GC?    No data found.  Updated Vital Signs BP (!) 121/57 (BP Location: Right Arm)   Pulse 87   Temp 98.3 F (36.8 C) (Oral)   Resp 18   SpO2 100%    Physical Exam  Constitutional: She is oriented to person, place, and time. She appears well-developed and well-nourished. No distress.  HENT:  Head: Normocephalic and atraumatic.  Mouth/Throat: Uvula is midline, oropharynx is clear and moist and mucous membranes are normal. No oral lesions. No trismus in the  jaw. No posterior oropharyngeal erythema.  Eyes: Conjunctivae are normal.  Neck: Neck supple.  Cardiovascular: Normal rate and regular rhythm.  No murmur heard. Pulmonary/Chest: Effort normal and breath sounds normal. No respiratory distress. She has no wheezes. She exhibits tenderness.  Coughing occasionally in room, mild pain with palpation across chest, worse near sternum  Abdominal: Soft. She exhibits no distension. There is tenderness. There is no rebound.  Mild tenderness to palpation of suprapubic area, negative Murphy's, Negative McBurney's  Musculoskeletal: She exhibits no edema.  Neurological: She is alert and oriented to person, place, and time.  Skin: Skin is warm and dry.  Psychiatric: She has a normal mood and affect.  Nursing note and vitals reviewed.    UC Treatments / Results  Labs (all labs ordered are listed, but only abnormal results are displayed) Labs Reviewed - No data to display  EKG  EKG Interpretation None       Radiology No results found.  Procedures Procedures (including critical care time)  Medications Ordered in UC Medications  gi cocktail (Maalox,Lidocaine,Donnatal) (30 mLs Oral Given 07/18/17 1205)     Initial Impression / Assessment and Plan / UC Course  I have reviewed the triage vital signs and the nursing notes.  Pertinent labs & imaging results that were available during my care of the patient were reviewed by me and considered in my medical decision making (see chart for details).    Chest pain does not appear cardiac, no personal history of cardiac disease. Family history of DM and HTN, no history of MI. GI cocktail given in clinic today, provided moderate relief. Will do a trail of a daily PPI , advised to take consistently for 2 weeks and monitor for symptom improvement. Patient plans to monitor symptoms to see if they resolve with daily PPI. Advised to remain upright 30 min after eating, provided list of foods that may increase  acid.   Chest discomfort may be mixed muscular strain vs. Costochondritis. Seems to have to do heavy lifting at work. Will treat with anti-inflammatories. Continue Tylenol, may try ibuprofen but advised it may worsen heart burn and stomach pain, Take with food. Refrain from heavy lifting for 5 days. Ice/Heat  Daily anti-histamine, mucinex or flonase for congestion.  Return here or follow up with PCP in 2 weeks if symptoms not resolving. Return sooner if vomiting persists, shortness of breath, increased chest pain, weakness, lightheadness. Discussed return precautions to include above. Patient verbalized understanding and is agreeable with  plan.    Final Clinical Impressions(s) / UC Diagnoses   Final diagnoses:  Chest discomfort    ED Discharge Orders        Ordered    omeprazole (PRILOSEC) 20 MG capsule  Daily     07/18/17 1210       Controlled Substance Prescriptions [place] Controlled Substance Registry consulted? Not Applicable   Lew DawesWieters, Breslyn Abdo C, [place]PA-C 07/18/17 1237

## 2017-07-18 NOTE — Discharge Instructions (Addendum)
We will do a trial of a daily acid reducer- Prilosec/omeprazole- please take daily consistently for 2 weeks. Please monitor if chest discomfort improves. Avoid foods that increase acid- see attachment.  Also would like to treat chest discomfort as muscular with anti-inflammatories. Continue to use Tylenol- may use 500 mg -100 mg every 8 hours, no more than 3,000 mg in 1 day. May try Ibuprofen, this may cause worsening heartburn though.   For congestion you may try a daily antihistamine like zyrtec, allegra, claritin. May also try mucinex or flonase.   Please return in 2 weeks if you have not noticed any improvement in symptoms. Please return sooner if chest pain worsens, develop shortness of breath.

## 2017-09-12 ENCOUNTER — Other Ambulatory Visit: Payer: Self-pay

## 2017-09-12 ENCOUNTER — Emergency Department (HOSPITAL_COMMUNITY): Payer: Self-pay

## 2017-09-12 ENCOUNTER — Emergency Department (HOSPITAL_COMMUNITY)
Admission: EM | Admit: 2017-09-12 | Discharge: 2017-09-12 | Disposition: A | Discharge status: Home / Self Care | Payer: Self-pay | Attending: Emergency Medicine | Admitting: Emergency Medicine

## 2017-09-12 ENCOUNTER — Encounter (HOSPITAL_COMMUNITY): Payer: Self-pay

## 2017-09-12 DIAGNOSIS — Z79899 Other long term (current) drug therapy: Secondary | ICD-10-CM | POA: Insufficient documentation

## 2017-09-12 DIAGNOSIS — Z87891 Personal history of nicotine dependence: Secondary | ICD-10-CM | POA: Insufficient documentation

## 2017-09-12 DIAGNOSIS — R112 Nausea with vomiting, unspecified: Secondary | ICD-10-CM | POA: Insufficient documentation

## 2017-09-12 DIAGNOSIS — N83202 Unspecified ovarian cyst, left side: Secondary | ICD-10-CM | POA: Insufficient documentation

## 2017-09-12 DIAGNOSIS — R1012 Left upper quadrant pain: Secondary | ICD-10-CM | POA: Insufficient documentation

## 2017-09-12 LAB — URINALYSIS, ROUTINE W REFLEX MICROSCOPIC
BILIRUBIN URINE: NEGATIVE
Glucose, UA: NEGATIVE mg/dL
HGB URINE DIPSTICK: NEGATIVE
KETONES UR: 20 mg/dL — AB
Nitrite: NEGATIVE
Protein, ur: 100 mg/dL — AB
Specific Gravity, Urine: 1.025 (ref 1.005–1.030)
pH: 9 — ABNORMAL HIGH (ref 5.0–8.0)

## 2017-09-12 LAB — CBC
HEMATOCRIT: 37.4 % (ref 36.0–46.0)
HEMOGLOBIN: 12.5 g/dL (ref 12.0–15.0)
MCH: 31.3 pg (ref 26.0–34.0)
MCHC: 33.4 g/dL (ref 30.0–36.0)
MCV: 93.5 fL (ref 78.0–100.0)
Platelets: 420 10*3/uL — ABNORMAL HIGH (ref 150–400)
RBC: 4 MIL/uL (ref 3.87–5.11)
RDW: 12.3 % (ref 11.5–15.5)
WBC: 10.7 10*3/uL — AB (ref 4.0–10.5)

## 2017-09-12 LAB — COMPREHENSIVE METABOLIC PANEL
ALT: 26 U/L (ref 14–54)
ANION GAP: 8 (ref 5–15)
AST: 24 U/L (ref 15–41)
Albumin: 4.4 g/dL (ref 3.5–5.0)
Alkaline Phosphatase: 50 U/L (ref 38–126)
BUN: 13 mg/dL (ref 6–20)
CHLORIDE: 104 mmol/L (ref 101–111)
CO2: 25 mmol/L (ref 22–32)
Calcium: 9.7 mg/dL (ref 8.9–10.3)
Creatinine, Ser: 0.61 mg/dL (ref 0.44–1.00)
Glucose, Bld: 105 mg/dL — ABNORMAL HIGH (ref 65–99)
POTASSIUM: 4 mmol/L (ref 3.5–5.1)
Sodium: 137 mmol/L (ref 135–145)
Total Bilirubin: 0.4 mg/dL (ref 0.3–1.2)
Total Protein: 8 g/dL (ref 6.5–8.1)

## 2017-09-12 LAB — WET PREP, GENITAL
Clue Cells Wet Prep HPF POC: NONE SEEN
Sperm: NONE SEEN
Trich, Wet Prep: NONE SEEN
WBC, Wet Prep HPF POC: NONE SEEN
Yeast Wet Prep HPF POC: NONE SEEN

## 2017-09-12 LAB — I-STAT BETA HCG BLOOD, ED (MC, WL, AP ONLY)

## 2017-09-12 LAB — LIPASE, BLOOD: LIPASE: 28 U/L (ref 11–51)

## 2017-09-12 MED ORDER — SODIUM CHLORIDE 0.9 % IV BOLUS (SEPSIS)
1000.0000 mL | Freq: Once | INTRAVENOUS | Status: AC
  Administered 2017-09-12: 1000 mL via INTRAVENOUS

## 2017-09-12 MED ORDER — IOPAMIDOL (ISOVUE-300) INJECTION 61%
INTRAVENOUS | Status: AC
  Filled 2017-09-12: qty 150

## 2017-09-12 MED ORDER — MORPHINE SULFATE (PF) 4 MG/ML IV SOLN
4.0000 mg | Freq: Once | INTRAVENOUS | Status: AC
  Administered 2017-09-12: 4 mg via INTRAVENOUS
  Filled 2017-09-12: qty 1

## 2017-09-12 MED ORDER — IBUPROFEN 600 MG PO TABS: 600.0000 mg | ORAL_TABLET | Freq: Three times a day (TID) | ORAL | 0 refills | Status: DC | PRN

## 2017-09-12 MED ORDER — IOPAMIDOL (ISOVUE-300) INJECTION 61%
INTRAVENOUS | Status: AC
  Administered 2017-09-12: 100 mL
  Filled 2017-09-12: qty 100

## 2017-09-12 MED ORDER — ONDANSETRON 4 MG PO TBDP: 4.0000 mg | ORAL_TABLET | Freq: Three times a day (TID) | ORAL | 0 refills | Status: DC | PRN

## 2017-09-12 MED ORDER — HYDROCODONE-ACETAMINOPHEN 5-325 MG PO TABS: 1.0000 | ORAL_TABLET | Freq: Four times a day (QID) | ORAL | 0 refills | Status: DC | PRN

## 2017-09-12 MED ORDER — HYDROCODONE-ACETAMINOPHEN 5-325 MG PO TABS
1.0000 | ORAL_TABLET | Freq: Once | ORAL | Status: AC
  Administered 2017-09-12: 1 via ORAL
  Filled 2017-09-12: qty 1

## 2017-09-12 MED ORDER — NAPROXEN 375 MG PO TABS
375.0000 mg | ORAL_TABLET | Freq: Once | ORAL | Status: AC
  Administered 2017-09-12: 375 mg via ORAL
  Filled 2017-09-12: qty 1

## 2017-09-12 MED ORDER — KETOROLAC TROMETHAMINE 15 MG/ML IJ SOLN
15.0000 mg | Freq: Once | INTRAMUSCULAR | Status: AC
  Administered 2017-09-12: 15 mg via INTRAVENOUS
  Filled 2017-09-12: qty 1

## 2017-09-12 MED ORDER — ONDANSETRON HCL 4 MG/2ML IJ SOLN
4.0000 mg | Freq: Once | INTRAMUSCULAR | Status: AC
  Administered 2017-09-12: 4 mg via INTRAVENOUS
  Filled 2017-09-12: qty 2

## 2017-09-12 NOTE — ED Provider Notes (Signed)
[facility]-EMERGENCY DEPT Provider Note   CSN: [id number] Arrival date & time: 09/12/17  1417     History   Chief Complaint Chief Complaint  Patient presents with  . Abdominal Pain    HPI Len ChildsDominique Lorey is a 32 y.o. female.  HPI   32 year old female with past medical history as below here with left flank pain.  The patient states her pain began acutely after she woke up around 6 AM this morning.  She describes it as an aching, cramping, gnawing pain in her left flank that radiates occasionally down towards her left groin.  She has had associated sensation that she needs to urinate frequently.  Denies any dysuria.  No fevers or chills.  She has had nausea and several episodes of emesis throughout the day.  She denies any change in her bowel habits but does state that sitting down to have a bowel movement did not increase her pain.  No previous history of kidney stones or similar episodes.  No history of note diverticulitis.  She denies any history of lifelong constipation.  No vaginal bleeding or discharge.  She is status post bilateral tubal ligation.  Denies any recent dyspareunia.  Pain is slightly improved with position changes.  No specific alleviating factors.  Past Medical History:  Diagnosis Date  . Anemia   . GERD (gastroesophageal reflux disease)    with pregnancy only  - no med  . Preterm labor   . SVD (spontaneous vaginal delivery)    x 5    Patient Active Problem List   Diagnosis Date Noted  . Encounter for female sterilization procedure 12/19/2015  . Preterm delivery, delivered 11/06/2015    Past Surgical History:  Procedure Laterality Date  . LAPAROSCOPIC TUBAL LIGATION Bilateral 12/19/2015   Procedure: LAPAROSCOPIC TUBAL LIGATION with Anna GenreFilshe Clips;  Surgeon: Willodean Rosenthalarolyn Harraway-Smith, MD;  Location: [facility];  Service: Gynecology;  Laterality: Bilateral;  . WISDOM TOOTH EXTRACTION     ALL 4    OB History    Gravida Para Term Preterm AB  Living   5 5 2 3   5    SAB TAB Ectopic Multiple Live Births         0 5       Home Medications    Prior to Admission medications   Medication Sig Start Date End Date Taking? Authorizing Provider  omeprazole (PRILOSEC) 20 MG capsule Take 20 mg by mouth daily.   Yes [provider]  HYDROcodone-acetaminophen (NORCO/VICODIN) 5-325 MG tablet Take 1-2 tablets by mouth every 6 (six) hours as needed for severe pain. 09/12/17   Shaune PollackIsaacs, Lundyn Coste, MD  ibuprofen (ADVIL,MOTRIN) 600 MG tablet Take 1 tablet (600 mg total) by mouth every 8 (eight) hours as needed for moderate pain. 09/12/17   Shaune PollackIsaacs, Micai Apolinar, MD  omeprazole (PRILOSEC) 20 MG capsule Take 1 capsule (20 mg total) by mouth daily. 07/18/17 08/17/17  Wieters, Hallie C, PA-C  ondansetron (ZOFRAN ODT) 4 MG disintegrating tablet Take 1 tablet (4 mg total) by mouth every 8 (eight) hours as needed for nausea or vomiting. 09/12/17   Shaune PollackIsaacs, Menaal Russum, MD    Family History Family History  Problem Relation Age of Onset  . Diabetes Maternal Aunt   . Diabetes Maternal Grandmother     Social History Social History   Tobacco Use  . Smoking status: Former Smoker    Packs/day: 0.25    Types: Cigarettes    Last attempt to quit: 03/07/2015    Years since  quitting: 2.5  . Smokeless tobacco: Never Used  Substance Use Topics  . Alcohol use: No  . Drug use: No     Allergies   Patient has no known allergies.   Review of Systems Review of Systems  Constitutional: Positive for fatigue. Negative for chills and fever.  HENT: Negative for congestion, rhinorrhea and sore throat.   Eyes: Negative for visual disturbance.  Respiratory: Negative for cough, shortness of breath and wheezing.   Cardiovascular: Negative for chest pain and leg swelling.  Gastrointestinal: Positive for abdominal pain, nausea and vomiting. Negative for diarrhea.  Genitourinary: Positive for flank pain. Negative for dysuria, vaginal bleeding and vaginal discharge.    Musculoskeletal: Negative for neck pain.  Skin: Negative for rash.  Allergic/Immunologic: Negative for immunocompromised state.  Neurological: Negative for syncope and headaches.  Hematological: Does not bruise/bleed easily.  All other systems reviewed and are negative.    Physical Exam Updated Vital Signs BP 105/61 (BP Location: Right Arm)   Pulse 64   Temp 98.2 F (36.8 C) (Oral)   Resp 14   LMP 08/14/2017   SpO2 97%   Physical Exam  Constitutional: She is oriented to person, place, and time. She appears well-developed and well-nourished. She appears distressed.  Standing, pacing in pain  HENT:  Head: Normocephalic and atraumatic.  Eyes: Conjunctivae are normal.  Neck: Neck supple.  Cardiovascular: Normal rate, regular rhythm and normal heart sounds. Exam reveals no friction rub.  No murmur heard. Pulmonary/Chest: Effort normal and breath sounds normal. No respiratory distress. She has no wheezes. She has no rales.  Abdominal: She exhibits no distension. There is tenderness in the left upper quadrant and left lower quadrant. There is no rigidity, no rebound, no guarding and no CVA tenderness.  Musculoskeletal: She exhibits no edema.  Neurological: She is alert and oriented to person, place, and time. She exhibits normal muscle tone.  Skin: Skin is warm. Capillary refill takes less than 2 seconds.  Psychiatric: She has a normal mood and affect.  Nursing note and vitals reviewed.    ED Treatments / Results  Labs (all labs ordered are listed, but only abnormal results are displayed) Labs Reviewed  COMPREHENSIVE METABOLIC PANEL - Abnormal; Notable for the following components:      Result Value   Glucose, Bld 105 (*)    All other components within normal limits  CBC - Abnormal; Notable for the following components:   WBC 10.7 (*)    Platelets 420 (*)    All other components within normal limits  URINALYSIS, ROUTINE W REFLEX MICROSCOPIC - Abnormal; Notable for the  following components:   APPearance HAZY (*)    pH 9.0 (*)    Ketones, ur 20 (*)    Protein, ur 100 (*)    Leukocytes, UA SMALL (*)    Bacteria, UA RARE (*)    Squamous Epithelial / LPF 6-30 (*)    All other components within normal limits  WET PREP, GENITAL  LIPASE, BLOOD  HIV ANTIBODY (ROUTINE TESTING)  I-STAT BETA HCG BLOOD, ED ([facility], [facility], [facility] ONLY)  GC/CHLAMYDIA PROBE AMP ([place]) NOT AT [facility]    EKG  EKG Interpretation None       Radiology US Transvaginal Non-ob  Result Date: 09/12/2017 CLINICAL DATA:  Initial evaluation for acute left adnexal pain. EXAM: TRANSABDOMINAL AND TRANSVAGINAL ULTRASOUND OF PELVIS DOPPLER ULTRASOUND OF OVARIES TECHNIQUE: Both transabdominal and transvaginal ultrasound examinations of the pelvis were performed. Transabdominal technique was performed for global imaging  of the pelvis including uterus, ovaries, adnexal regions, and pelvic cul-de-sac. It was necessary to proceed with endovaginal exam following the transabdominal exam to visualize the uterus and ovaries. Color and duplex Doppler ultrasound was utilized to evaluate blood flow to the ovaries. COMPARISON:  Prior CT from 09/12/2017 FINDINGS: Uterus Measurements: 10.0 x 4.6 x 4.7 cm. No fibroids or other mass visualized. Endometrium Thickness: 10.1 mm.  No focal abnormality visualized. Right ovary Measurements: 4.1 x 2.3 x 2.6 cm. Normal appearance/no adnexal mass. Left ovary Measurements: 3.8 x 2.2 x 2.9 cm. Normal appearance/no adnexal mass. Small 2 cm simple cyst noted, most consistent with a normal physiologic cyst/dominant follicle. Pulsed Doppler evaluation of both ovaries demonstrates normal low-resistance arterial and venous waveforms. Other findings No abnormal free fluid. IMPRESSION: Normal pelvic ultrasound. No evidence for torsion or other acute abnormality. Electronically Signed   By: Rise Mu M.D.   On: 09/12/2017 19:23   US Pelvis Complete  Result Date: 09/12/2017 CLINICAL  DATA:  Initial evaluation for acute left adnexal pain. EXAM: TRANSABDOMINAL AND TRANSVAGINAL ULTRASOUND OF PELVIS DOPPLER ULTRASOUND OF OVARIES TECHNIQUE: Both transabdominal and transvaginal ultrasound examinations of the pelvis were performed. Transabdominal technique was performed for global imaging of the pelvis including uterus, ovaries, adnexal regions, and pelvic cul-de-sac. It was necessary to proceed with endovaginal exam following the transabdominal exam to visualize the uterus and ovaries. Color and duplex Doppler ultrasound was utilized to evaluate blood flow to the ovaries. COMPARISON:  Prior CT from 09/12/2017 FINDINGS: Uterus Measurements: 10.0 x 4.6 x 4.7 cm. No fibroids or other mass visualized. Endometrium Thickness: 10.1 mm.  No focal abnormality visualized. Right ovary Measurements: 4.1 x 2.3 x 2.6 cm. Normal appearance/no adnexal mass. Left ovary Measurements: 3.8 x 2.2 x 2.9 cm. Normal appearance/no adnexal mass. Small 2 cm simple cyst noted, most consistent with a normal physiologic cyst/dominant follicle. Pulsed Doppler evaluation of both ovaries demonstrates normal low-resistance arterial and venous waveforms. Other findings No abnormal free fluid. IMPRESSION: Normal pelvic ultrasound. No evidence for torsion or other acute abnormality. Electronically Signed   By: Rise Mu M.D.   On: 09/12/2017 19:23   Ct Abdomen Pelvis W Contrast  Result Date: 09/12/2017 CLINICAL DATA:  Left lower quadrant pain EXAM: CT ABDOMEN AND PELVIS WITH CONTRAST TECHNIQUE: Multidetector CT imaging of the abdomen and pelvis was performed using the standard protocol following bolus administration of intravenous contrast. CONTRAST:  ISOVUE-300 IOPAMIDOL (ISOVUE-300) INJECTION 61% COMPARISON:  None. FINDINGS: Lower chest: Lung bases are clear. No effusions. Heart is normal size. Hepatobiliary: No focal hepatic abnormality. Gallbladder unremarkable. Pancreas: No focal abnormality or ductal dilatation.  Spleen: No focal abnormality.  Normal size. Adrenals/Urinary Tract: No adrenal abnormality. No focal renal abnormality. No stones or hydronephrosis. Urinary bladder is unremarkable. Stomach/Bowel: Appendix not definitively seen. No pericecal inflammation. Stomach, large and small bowel grossly unremarkable. Vascular/Lymphatic: No evidence of aneurysm or adenopathy. Reproductive: Small cyst or dominant follicle in the left ovary measuring 2.2 cm. Uterus and adnexa unremarkable. No mass. Other: No free fluid or free air. Musculoskeletal: No acute bony abnormality. IMPRESSION: No acute findings in the abdomen or pelvis. Electronically Signed   By: Charlett Nose M.D.   On: 09/12/2017 17:51   [place] Art/ven Flow Abd Pelv Doppler  Result Date: 09/12/2017 CLINICAL DATA:  Initial evaluation for acute left adnexal pain. EXAM: TRANSABDOMINAL AND TRANSVAGINAL ULTRASOUND OF PELVIS DOPPLER ULTRASOUND OF OVARIES TECHNIQUE: Both transabdominal and transvaginal ultrasound examinations of the pelvis were performed. Transabdominal technique was performed  for global imaging of the pelvis including uterus, ovaries, adnexal regions, and pelvic cul-de-sac. It was necessary to proceed with endovaginal exam following the transabdominal exam to visualize the uterus and ovaries. Color and duplex Doppler ultrasound was utilized to evaluate blood flow to the ovaries. COMPARISON:  Prior CT from 09/12/2017 FINDINGS: Uterus Measurements: 10.0 x 4.6 x 4.7 cm. No fibroids or other mass visualized. Endometrium Thickness: 10.1 mm.  No focal abnormality visualized. Right ovary Measurements: 4.1 x 2.3 x 2.6 cm. Normal appearance/no adnexal mass. Left ovary Measurements: 3.8 x 2.2 x 2.9 cm. Normal appearance/no adnexal mass. Small 2 cm simple cyst noted, most consistent with a normal physiologic cyst/dominant follicle. Pulsed Doppler evaluation of both ovaries demonstrates normal low-resistance arterial and venous waveforms. Other findings No abnormal  free fluid. IMPRESSION: Normal pelvic ultrasound. No evidence for torsion or other acute abnormality. Electronically Signed   By: Rise Mu M.D.   On: 09/12/2017 19:23    Procedures Procedures (including critical care time)  Medications Ordered in ED Medications  sodium chloride 0.9 % bolus 1,000 mL (0 mLs Intravenous Stopped 09/12/17 1619)  sodium chloride 0.9 % bolus 1,000 mL (0 mLs Intravenous Stopped 09/12/17 1619)  ketorolac (TORADOL) 15 MG/ML injection 15 mg (15 mg Intravenous Given 09/12/17 1517)  morphine 4 MG/ML injection 4 mg (4 mg Intravenous Given 09/12/17 1517)  ondansetron (ZOFRAN) injection 4 mg (4 mg Intravenous Given 09/12/17 1517)  iopamidol (ISOVUE-300) 61 % injection (100 mLs  Contrast Given 09/12/17 1733)  morphine 4 MG/ML injection 4 mg (4 mg Intravenous Given 09/12/17 1833)     Initial Impression / Assessment and Plan / ED Course  I have reviewed the triage vital signs and the nursing notes.  Pertinent labs & imaging results that were available during my care of the patient were reviewed by me and considered in my medical decision making (see chart for details).     32 year old female here with left flank pain, nausea, and vomiting.  History and exam is consistent with possible stone disease, less likely diverticulitis or colitis.  Denies any cough, sputum production, or symptoms to suggest referred pain from pulmonary source.  Denies any vaginal bleeding, discharge, and pain began in the flank with minimal adnexal pain, and I do not suspect ovarian torsion, PID, or TOA.  Given undifferentiated nature of her pain, however, will obtain CT stone study for further assessment and give fluids and pain control.  Lab work reviewed as above.  Patient is a mild leukocytosis.  Urinalysis shows ketonuria consistent with dehydration.  She has rare bacteria but no significant pyuria.  PH 9.  Awaiting CT.  CT scan negative.  Patient does have a left ovarian cyst which could be  contributing to her symptoms.  Ultrasound shows no evidence of torsion.  Pelvic exam is unremarkable.  Her pain is improved here in the ED.  Given reassuring labs and imaging, do not feel acute emergent pathology is likely.  Will treat symptomatically, discharged with outpatient follow-up.  Final Clinical Impressions(s) / ED Diagnoses   Final diagnoses:  Cyst of left ovary  Left upper quadrant pain    ED Discharge Orders        Ordered    ibuprofen (ADVIL,MOTRIN) 600 MG tablet  Every 8 hours PRN     09/12/17 1946    ondansetron (ZOFRAN ODT) 4 MG disintegrating tablet  Every 8 hours PRN     09/12/17 1946    HYDROcodone-acetaminophen (NORCO/VICODIN) 5-325 MG tablet  Every 6  hours PRN     09/12/17 1946       Shaune Pollack, MD 09/12/17 1946

## 2017-09-12 NOTE — ED Triage Notes (Signed)
Patient BIB EMS from home with complaints of LLQ abdominal pain with radiation to LUQ. Patient denies trauma. Patient denies frequency/urgency/dysuria. Patient reports inconsistency with bowel movements and reports "difficulty" voiding. Patient reports 10/10 pain for EMS. Patient vomited while en route in EMS, but denies nausea at this time. Patient with history of anemia and GERD. EMS VS: 135/88, HR72, Sat 100% RA

## 2017-09-12 NOTE — ED Notes (Signed)
Bed: WA01 Expected date:  Expected time:  Means of arrival:  Comments: 32 yo abd pain

## 2017-09-13 LAB — HIV ANTIBODY (ROUTINE TESTING W REFLEX): HIV SCREEN 4TH GENERATION: NONREACTIVE

## 2017-09-15 LAB — GC/CHLAMYDIA PROBE AMP (~~LOC~~) NOT AT ARMC
CHLAMYDIA, DNA PROBE: NEGATIVE
Neisseria Gonorrhea: NEGATIVE

## 2017-11-04 ENCOUNTER — Encounter: Payer: Self-pay | Admitting: Family Medicine

## 2017-11-04 ENCOUNTER — Ambulatory Visit (INDEPENDENT_AMBULATORY_CARE_PROVIDER_SITE_OTHER): Payer: Medicaid Other | Admitting: Family Medicine

## 2017-11-04 VITALS — BP 121/63 | HR 72 | Wt 135.4 lb

## 2017-11-04 DIAGNOSIS — M545 Low back pain, unspecified: Secondary | ICD-10-CM

## 2017-11-04 DIAGNOSIS — G8929 Other chronic pain: Secondary | ICD-10-CM | POA: Insufficient documentation

## 2017-11-04 MED ORDER — CYCLOBENZAPRINE HCL 10 MG PO TABS: 10.0000 mg | ORAL_TABLET | Freq: Three times a day (TID) | ORAL | 1 refills | Status: DC | PRN

## 2017-11-04 MED ORDER — DICLOFENAC SODIUM 75 MG PO TBEC: 75.0000 mg | DELAYED_RELEASE_TABLET | Freq: Two times a day (BID) | ORAL | 2 refills | Status: DC

## 2017-11-04 NOTE — Assessment & Plan Note (Signed)
Unclear if related to cycle or not--could this be some post-tubal ligation syndrome?--trial of diclofenac, no ibuprofen to be used with this. Take with food. May add tylenol if needed. Added heat, back stretches, flexeril prn. If not improved may need a more definitive treatment.Marland Kitchen..Marland Kitchen

## 2017-11-04 NOTE — Progress Notes (Signed)
   Subjective:    Patient ID: [name] is a 32 y.o. female presenting with Flank Pain  on 11/04/2017  HPI: Several month h/o pain in her side. Noted it was started up in left flank. Feels like a stitch in her side. Notes that it is sometimes related to her cycle. Stated that she has had her tubes tied. LMP 7 days ago, pain increased in last 4 days. Pain before that was 3 wks ago. She has been taking 1200 mg Ibuprofen q 4 hours during this time and using IcyHot pack for pain.   Review of Systems  Constitutional: Negative for chills and fever.  Respiratory: Negative for shortness of breath.   Cardiovascular: Negative for chest pain.  Gastrointestinal: Negative for abdominal pain, nausea and vomiting.  Genitourinary: Negative for dysuria.  Skin: Negative for rash.      Objective:    BP 121/63 (BP Location: Right Arm, Patient Position: Sitting, Cuff Size: Normal)   Pulse 72   Wt 135 lb 6.4 oz (61.4 kg)   LMP 10/28/2017 (Exact Date)   BMI 26.44 kg/m  Physical Exam  Constitutional: She is oriented to person, place, and time. She appears well-developed and well-nourished. No distress.  HENT:  Head: Normocephalic and atraumatic.  Eyes: No scleral icterus.  Neck: Neck supple.  Cardiovascular: Normal rate.  Pulmonary/Chest: Effort normal.  Abdominal: Soft.  Musculoskeletal: Normal range of motion.  Left para-spinous muscle tenderness  Neurological: She is alert and oriented to person, place, and time.  Skin: Skin is warm and dry.  Psychiatric: She has a normal mood and affect.        Assessment & Plan:   Problem List Items Addressed This Visit      Unprioritized   Chronic left-sided low back pain without sciatica - Primary    Unclear if related to cycle or not--could this be some post-tubal ligation syndrome?--trial of diclofenac, no ibuprofen to be used with this. Take with food. May add tylenol if needed. Added heat, back stretches, flexeril prn. If not improved may  need a more definitive treatment...      Relevant Medications   diclofenac (VOLTAREN) 75 MG EC tablet   cyclobenzaprine (FLEXERIL) 10 MG tablet      Total face-to-face time with patient: 15 minutes. Over 50% of encounter was spent on counseling and coordination of care. Return in about 3 months (around 02/04/2018).  Reva Boresanya S Lateefa Crosby 11/04/2017 11:01 AM

## 2017-11-04 NOTE — Patient Instructions (Signed)
Flank Pain Flank pain is pain in your side. The flank is the area of your side between your upper belly (abdomen) and your back. The pain may occur over a short period of time (acute) or may be long-term or come back often (chronic). It may be mild or very bad. Pain in this area can be caused by many different things. Follow these instructions at home:  Rest as told by your doctor.  Drink enough fluid to keep your pee (urine) clear or pale yellow.  Take over-the-counter and prescription medicines only as told by your doctor.  Keep all follow-up visits as told by your doctor. This is important. Contact a doctor if:  Medicine does not help your pain.  You have new symptoms.  Your pain gets worse.  You have a fever.  Your symptoms last longer than 2-3 days. Get help right away if:  Your tummy hurts or is swollen.  You are short of breath.  You feel sick to your stomach (nauseous) and it does not go away.  You cannot stop throwing up (vomiting).  You feel like you will pass out or you do pass out (faint).  You have blood in your pee.  You have a fever and your symptoms suddenly get worse. This information is not intended to replace advice given to you by your health care provider. Make sure you discuss any questions you have with your health care provider. Document Released: 05/07/2008 Document Revised: 04/19/2016 Document Reviewed: 05/02/2015 Elsevier Interactive Patient Education  2018 [employer]  

## 2017-12-22 ENCOUNTER — Other Ambulatory Visit: Payer: Self-pay | Admitting: Family Medicine

## 2017-12-22 DIAGNOSIS — M545 Low back pain, unspecified: Secondary | ICD-10-CM

## 2017-12-22 DIAGNOSIS — G8929 Other chronic pain: Secondary | ICD-10-CM

## 2018-08-25 ENCOUNTER — Ambulatory Visit: Payer: Medicaid Other | Admitting: Obstetrics & Gynecology

## 2018-08-25 ENCOUNTER — Encounter: Payer: Self-pay | Admitting: Advanced Practice Midwife

## 2018-08-25 ENCOUNTER — Other Ambulatory Visit (HOSPITAL_COMMUNITY)
Admission: RE | Admit: 2018-08-25 | Discharge: 2018-08-25 | Disposition: A | Discharge status: Home / Self Care | Payer: Medicaid Other | Source: Ambulatory Visit | Attending: Advanced Practice Midwife | Admitting: Advanced Practice Midwife

## 2018-08-25 ENCOUNTER — Ambulatory Visit (INDEPENDENT_AMBULATORY_CARE_PROVIDER_SITE_OTHER): Payer: Medicaid Other | Admitting: Advanced Practice Midwife

## 2018-08-25 VITALS — BP 104/67 | HR 73 | Wt 140.0 lb

## 2018-08-25 DIAGNOSIS — Z01419 Encounter for gynecological examination (general) (routine) without abnormal findings: Secondary | ICD-10-CM | POA: Insufficient documentation

## 2018-08-25 DIAGNOSIS — R1031 Right lower quadrant pain: Secondary | ICD-10-CM

## 2018-08-25 DIAGNOSIS — Z862 Personal history of diseases of the blood and blood-forming organs and certain disorders involving the immune mechanism: Secondary | ICD-10-CM

## 2018-08-25 DIAGNOSIS — N83299 Other ovarian cyst, unspecified side: Secondary | ICD-10-CM

## 2018-08-25 DIAGNOSIS — Z9851 Tubal ligation status: Secondary | ICD-10-CM | POA: Insufficient documentation

## 2018-08-25 LAB — CBC
HEMOGLOBIN: 11.4 g/dL (ref 11.1–15.9)
Hematocrit: 34.8 % (ref 34.0–46.6)
MCH: 31.1 pg (ref 26.6–33.0)
MCHC: 32.8 g/dL (ref 31.5–35.7)
MCV: 95 fL (ref 79–97)
PLATELETS: 353 10*3/uL (ref 150–450)
RBC: 3.66 x10E6/uL — AB (ref 3.77–5.28)
RDW: 10.8 % — ABNORMAL LOW (ref 11.7–15.4)
WBC: 8.8 10*3/uL (ref 3.4–10.8)

## 2018-08-25 LAB — POCT PREGNANCY, URINE: PREG TEST UR: NEGATIVE

## 2018-08-25 MED ORDER — KETOROLAC TROMETHAMINE 10 MG PO TABS: 10.0000 mg | ORAL_TABLET | Freq: Four times a day (QID) | ORAL | 1 refills | Status: DC | PRN

## 2018-08-25 NOTE — Progress Notes (Signed)
GYNECOLOGY ANNUAL PREVENTATIVE CARE ENCOUNTER NOTE  Subjective:   [name] is a 33 y.o. [phone] female here for a routine annual gynecologic exam.  Current complaints: chronic abdominal pain. Patient endorses RLQ pain related to recurrent ovarian cysts. She has previously successfully managed this pain with Flexeril but is out of refills.  Denies abnormal vaginal bleeding, discharge, pelvic pain, problems with intercourse or other gynecologic concerns.    Gynecologic History No LMP recorded. Contraception: tubal ligation 12/19/2015 Last Pap: 06/11/2017. Results were: normal with negative HPV Last mammogram: N/A age 67.   Obstetric History OB History  Gravida Para Term Preterm AB Living  5 5 2 3   5   SAB TAB Ectopic Multiple Live Births        0 5    # Outcome Date GA Lbr Len/2nd Weight Sex Delivery Anes PTL Lv  5 Preterm 11/06/15 [redacted]w[redacted]d 06:44 / 00:09 3 lb 3.5 oz (1.46 kg) F Vag-Spont None  LIV  4 Preterm 01/09/14 [redacted]w[redacted]d 06:46 / 00:04 5 lb 1.3 oz (2.305 kg) F Vag-Spont None  LIV  3 Preterm 11/23/10 [redacted]w[redacted]d  4 lb 3 oz (1.899 kg) M Vag-Spont None Y LIV     Birth Comments: Labor > SROM  2 Term 11/28/08 [redacted]w[redacted]d  5 lb 2 oz (2.325 kg) F Vag-Spont None  LIV     Birth Comments: No complications  1 Term 01/30/00 [redacted]w[redacted]d  6 lb 2 oz (2.778 kg) M Vag-Spont None  LIV     Birth Comments: No complications    Past Medical History:  Diagnosis Date  . Anemia   . GERD (gastroesophageal reflux disease)    with pregnancy only  - no med  . Preterm labor   . SVD (spontaneous vaginal delivery)    x 5    Past Surgical History:  Procedure Laterality Date  . LAPAROSCOPIC TUBAL LIGATION Bilateral 12/19/2015   Procedure: LAPAROSCOPIC TUBAL LIGATION with Anna Genre Clips;  Surgeon: Willodean Rosenthal, MD;  Location: [facility];  Service: Gynecology;  Laterality: Bilateral;  . WISDOM TOOTH EXTRACTION     ALL 4    Current Outpatient Medications on File Prior to Visit  Medication Sig Dispense Refill    . cyclobenzaprine (FLEXERIL) 10 MG tablet Take 1 tablet (10 mg total) by mouth every 8 (eight) hours as needed for muscle spasms. (Patient not taking: Reported on 08/25/2018) 30 tablet 1  . diclofenac (VOLTAREN) 75 MG EC tablet Take 1 tablet (75 mg total) by mouth 2 (two) times daily with a meal. (Patient not taking: Reported on 08/25/2018) 60 tablet 2  . omeprazole (PRILOSEC) 20 MG capsule Take 1 capsule (20 mg total) by mouth daily. 30 capsule 0   No current facility-administered medications on file prior to visit.     No Known Allergies  Social History:  reports that she quit smoking about 3 years ago. Her smoking use included cigarettes. She smoked 0.25 packs per day. She has never used smokeless tobacco. She reports that she does not drink alcohol or use drugs.  Family History  Problem Relation Age of Onset  . Diabetes Maternal Aunt   . Diabetes Maternal Grandmother     The following portions of the patient's history were reviewed and updated as appropriate: allergies, current medications, past family history, past medical history, past social history, past surgical history and problem list.  Review of Systems Pertinent items noted in HPI and remainder of comprehensive ROS otherwise negative.   Objective:  BP 104/67  Pulse 73   Wt 140 lb (63.5 kg)   BMI 27.34 kg/m  CONSTITUTIONAL: Well-developed, well-nourished female in no acute distress.  HENT:  Normocephalic, atraumatic, External right and left ear normal. Oropharynx is clear and moist EYES: Conjunctivae and EOM are normal. Pupils are equal, round, and reactive to light. No scleral icterus.  NECK: Normal range of motion, supple, no masses.  Normal thyroid.  SKIN: Skin is warm and dry. No rash noted. Not diaphoretic. No erythema. No pallor. MUSCULOSKELETAL: Normal range of motion. No tenderness.  No cyanosis, clubbing, or edema.  2+ distal pulses. NEUROLOGIC: Alert and oriented to person, place, and time. Normal reflexes,  muscle tone coordination. No cranial nerve deficit noted. PSYCHIATRIC: Normal mood and affect. Normal behavior. Normal judgment and thought content. CARDIOVASCULAR: Normal heart rate noted, regular rhythm RESPIRATORY: Clear to auscultation bilaterally. Effort and breath sounds normal, no problems with respiration noted. BREASTS: Symmetric in size. No masses, skin changes, nipple drainage, or lymphadenopathy. ABDOMEN: Soft, normal bowel sounds, no distention noted.  No tenderness, rebound or guarding.  PELVIC: Deferred. Cervicovaginal swab collected via blind swab    Assessment and Plan:  1. Encounter for gynecological examination with Papanicolaou smear of cervix - No complaints or concerns - Normal pap 06/11/2017. Declined pap today - Cervicovaginal ancillary only( [place]) - POCT urine pregnancy  2. History of anemia - CBC per patient request  3. Abdominal pain related to ovarian cysts - Discussed Toradol vs Flexeril for pain. Patient willing to try Toradol - Negative pregnancy test prior to rx - Patient may call clinic for refill of Flexeril if she is not satisfied with pain coverage on Toradol - Urine Culture  Pap due 06/11/2022 Will follow up results of pap smear and manage accordingly. Routine preventative health maintenance measures emphasized. Please refer to After Visit Summary for other counseling recommendations.   Clayton Bibles, CNM [employer] for [employer], [facility]

## 2018-08-25 NOTE — Patient Instructions (Signed)
Preventive Care 18-39 Years, Female Preventive care refers to lifestyle choices and visits with your health care provider that can promote health and wellness. What does preventive care include?   A yearly physical exam. This is also called an annual well check.  Dental exams once or twice a year.  Routine eye exams. Ask your health care provider how often you should have your eyes checked.  Personal lifestyle choices, including: ? Daily care of your teeth and gums. ? Regular physical activity. ? Eating a healthy diet. ? Avoiding tobacco and drug use. ? Limiting alcohol use. ? Practicing safe sex. ? Taking vitamin and mineral supplements as recommended by your health care provider. What happens during an annual well check? The services and screenings done by your health care provider during your annual well check will depend on your age, overall health, lifestyle risk factors, and family history of disease. Counseling Your health care provider may ask you questions about your:  Alcohol use.  Tobacco use.  Drug use.  Emotional well-being.  Home and relationship well-being.  Sexual activity.  Eating habits.  Work and work environment.  Method of birth control.  Menstrual cycle.  Pregnancy history. Screening You may have the following tests or measurements:  Height, weight, and BMI.  Diabetes screening. This is done by checking your blood sugar (glucose) after you have not eaten for a while (fasting).  Blood pressure.  Lipid and cholesterol levels. These may be checked every 5 years starting at age 20.  Skin check.  Hepatitis C blood test.  Hepatitis B blood test.  Sexually transmitted disease (STD) testing.  BRCA-related cancer screening. This may be done if you have a family history of breast, ovarian, tubal, or peritoneal cancers.  Pelvic exam and Pap test. This may be done every 3 years starting at age 21. Starting at age 30, this may be done every 5  years if you have a Pap test in combination with an HPV test. Discuss your test results, treatment options, and if necessary, the need for more tests with your health care provider. Vaccines Your health care provider may recommend certain vaccines, such as:  Influenza vaccine. This is recommended every year.  Tetanus, diphtheria, and acellular pertussis (Tdap, Td) vaccine. You may need a Td booster every 10 years.  Varicella vaccine. You may need this if you have not been vaccinated.  HPV vaccine. If you are 26 or younger, you may need three doses over 6 months.  Measles, mumps, and rubella (MMR) vaccine. You may need at least one dose of MMR. You may also need a second dose.  Pneumococcal 13-valent conjugate (PCV13) vaccine. You may need this if you have certain conditions and were not previously vaccinated.  Pneumococcal polysaccharide (PPSV23) vaccine. You may need one or two doses if you smoke cigarettes or if you have certain conditions.  Meningococcal vaccine. One dose is recommended if you are age 19-21 years and a first-year college student living in a residence hall, or if you have one of several medical conditions. You may also need additional booster doses.  Hepatitis A vaccine. You may need this if you have certain conditions or if you travel or work in places where you may be exposed to hepatitis A.  Hepatitis B vaccine. You may need this if you have certain conditions or if you travel or work in places where you may be exposed to hepatitis B.  Haemophilus influenzae type b (Hib) vaccine. You may need this if you   have certain risk factors. Talk to your health care provider about which screenings and vaccines you need and how often you need them. This information is not intended to replace advice given to you by your health care provider. Make sure you discuss any questions you have with your health care provider. Document Released: 09/24/2001 Document Revised: 03/11/2017  Document Reviewed: 05/30/2015 Elsevier Interactive Patient Education  2019 [employer].

## 2018-08-26 LAB — CERVICOVAGINAL ANCILLARY ONLY
BACTERIAL VAGINITIS: POSITIVE — AB
Candida vaginitis: NEGATIVE

## 2018-08-26 LAB — URINE CULTURE: ORGANISM ID, BACTERIA: NO GROWTH

## 2018-08-26 NOTE — Progress Notes (Signed)
Bacterial Vaginosis. Please send Flagyl to pharmacy and notify patient. SW

## 2018-08-27 ENCOUNTER — Telehealth: Payer: Self-pay | Admitting: General Practice

## 2018-08-27 DIAGNOSIS — B9689 Other specified bacterial agents as the cause of diseases classified elsewhere: Secondary | ICD-10-CM

## 2018-08-27 DIAGNOSIS — N760 Acute vaginitis: Principal | ICD-10-CM

## 2018-08-27 MED ORDER — METRONIDAZOLE 500 MG PO TABS: 500.0000 mg | ORAL_TABLET | Freq: Two times a day (BID) | ORAL | 0 refills | Status: DC

## 2018-08-27 NOTE — Telephone Encounter (Signed)
-----   Message from Calvert Cantor, [place] sent at 08/26/2018  8:08 PM EST ----- Bacterial Vaginosis. Please send Flagyl to pharmacy and notify patient. SW

## 2018-08-27 NOTE — Telephone Encounter (Signed)
Called patient & informed her of results and medication instructions. Discussed to avoid alcohol while on the medication. Patient verbalized understanding & asked about Iron level results. Informed patient of normal Hgb. Patient verbalized understanding & had no questions.

## 2019-03-08 ENCOUNTER — Ambulatory Visit: Payer: Medicaid Other | Admitting: Family Medicine

## 2019-03-10 ENCOUNTER — Other Ambulatory Visit: Payer: Self-pay

## 2019-03-10 ENCOUNTER — Encounter: Payer: Self-pay | Admitting: Family Medicine

## 2019-03-10 ENCOUNTER — Ambulatory Visit (INDEPENDENT_AMBULATORY_CARE_PROVIDER_SITE_OTHER): Payer: Medicaid Other | Admitting: Family Medicine

## 2019-03-10 VITALS — BP 124/77 | HR 82 | Temp 98.5°F | Ht 60.0 in | Wt 150.1 lb

## 2019-03-10 DIAGNOSIS — M545 Low back pain: Secondary | ICD-10-CM | POA: Diagnosis not present

## 2019-03-10 DIAGNOSIS — R102 Pelvic and perineal pain: Secondary | ICD-10-CM

## 2019-03-10 DIAGNOSIS — G8929 Other chronic pain: Secondary | ICD-10-CM | POA: Diagnosis not present

## 2019-03-10 MED ORDER — CYCLOBENZAPRINE HCL 10 MG PO TABS: 10.0000 mg | ORAL_TABLET | Freq: Three times a day (TID) | ORAL | 1 refills | Status: DC | PRN

## 2019-03-10 NOTE — Progress Notes (Signed)
Scheduled u/s for 8/12 @ 1pm

## 2019-03-10 NOTE — Progress Notes (Signed)
   Subjective:    Patient ID: [name] is a 33 y.o. female presenting with Gynecologic Exam  on 03/10/2019  HPI: Reports very painful cycles following her BTL. Cycles are painful and in the few days after her cycles. Hot water or tub bath makes it better. Takes Ibuprofen or diclofenac and it upsets her stomach. Notes that same thing happened after an ovarian cyst. Pain is bilateral, radiates down her legs and into her back.  Review of Systems  Constitutional: Negative for chills and fever.  Respiratory: Negative for shortness of breath.   Cardiovascular: Negative for chest pain.  Gastrointestinal: Negative for abdominal pain, nausea and vomiting.  Genitourinary: Negative for dysuria.  Skin: Negative for rash.      Objective:    BP 124/77   Pulse 82   Temp 98.5 F (36.9 C)   Ht 5' (1.524 m)   Wt 150 lb 1.6 oz (68.1 kg)   LMP 02/13/2019 (Approximate)   BMI 29.31 kg/m  Physical Exam Constitutional:      General: She is not in acute distress.    Appearance: She is well-developed.  HENT:     Head: Normocephalic and atraumatic.  Eyes:     General: No scleral icterus. Neck:     Musculoskeletal: Neck supple.  Cardiovascular:     Rate and Rhythm: Normal rate.  Pulmonary:     Effort: Pulmonary effort is normal.  Abdominal:     Palpations: Abdomen is soft.  Skin:    General: Skin is warm and dry.  Neurological:     Mental Status: She is alert and oriented to person, place, and time.         Assessment & Plan:   Problem List Items Addressed This Visit      Unprioritized   Chronic left-sided low back pain without sciatica - Primary    Muscle relaxer given      Relevant Medications   cyclobenzaprine (FLEXERIL) 10 MG tablet   Pelvic pain    I am unclear what is causing her pain. She is a G6, is this adenomyosis or post tubal syndrome or undiagnosed endometiosis? Discussed treatment options, including OCP's, IUD, endometrial ablation and hyst. She will  consider options. We will check u/s in the mean time and return to discuss treatment options. Risks of each reviewed.      Relevant Orders   US PELVIC COMPLETE WITH TRANSVAGINAL      Total face-to-face time with patient: 15 minutes. Over 50% of encounter was spent on counseling and coordination of care. Return in about 4 weeks (around 04/07/2019).  Donnamae Jude 03/10/2019 3:15 PM

## 2019-03-11 DIAGNOSIS — R102 Pelvic and perineal pain: Secondary | ICD-10-CM | POA: Insufficient documentation

## 2019-03-11 NOTE — Assessment & Plan Note (Signed)
I am unclear what is causing her pain. She is a G6, is this adenomyosis or post tubal syndrome or undiagnosed endometiosis? Discussed treatment options, including OCP's, IUD, endometrial ablation and hyst. She will consider options. We will check u/s in the mean time and return to discuss treatment options. Risks of each reviewed.

## 2019-03-11 NOTE — Assessment & Plan Note (Signed)
Muscle relaxer given

## 2019-03-24 ENCOUNTER — Ambulatory Visit (HOSPITAL_COMMUNITY)
Admission: RE | Admit: 2019-03-24 | Discharge: 2019-03-24 | Disposition: A | Discharge status: Home / Self Care | Payer: Medicaid Other | Source: Ambulatory Visit | Attending: Family Medicine | Admitting: Family Medicine

## 2019-03-24 ENCOUNTER — Other Ambulatory Visit: Payer: Self-pay

## 2019-03-24 DIAGNOSIS — R102 Pelvic and perineal pain: Secondary | ICD-10-CM | POA: Insufficient documentation

## 2019-04-08 ENCOUNTER — Other Ambulatory Visit: Payer: Self-pay

## 2019-04-08 ENCOUNTER — Ambulatory Visit (INDEPENDENT_AMBULATORY_CARE_PROVIDER_SITE_OTHER): Payer: Medicaid Other | Admitting: Family Medicine

## 2019-04-08 ENCOUNTER — Encounter: Payer: Self-pay | Admitting: Family Medicine

## 2019-04-08 VITALS — BP 115/75 | HR 86 | Ht 59.0 in | Wt 155.0 lb

## 2019-04-08 DIAGNOSIS — R102 Pelvic and perineal pain: Secondary | ICD-10-CM

## 2019-04-08 MED ORDER — NORGESTIMATE-ETH ESTRADIOL 0.25-35 MG-MCG PO TABS
1.0000 | ORAL_TABLET | Freq: Every day | ORAL | 11 refills | Status: DC
Start: 2019-04-08 — End: 2019-09-29

## 2019-04-08 MED ORDER — CELECOXIB 200 MG PO CAPS: 200.0000 mg | ORAL_CAPSULE | Freq: Two times a day (BID) | ORAL | 1 refills | Status: DC | PRN

## 2019-04-08 NOTE — Patient Instructions (Signed)
Pelvic Pain, Female Pelvic pain is pain in your lower belly (abdomen), below your belly button and between your hips. The pain may start suddenly (be acute), keep coming back (be recurring), or last a long time (become chronic). Pelvic pain that lasts longer than 6 months is called chronic pelvic pain. There are many causes of pelvic pain. Sometimes the cause of pelvic pain is not known. Follow these instructions at home:   Take over-the-counter and prescription medicines only as told by your doctor.  Rest as told by your doctor.  Do not have sex if it hurts.  Keep a journal of your pelvic pain. Write down: ? When the pain started. ? Where the pain is located. ? What seems to make the pain better or worse, such as food or your period (menstrual cycle). ? Any symptoms you have along with the pain.  Keep all follow-up visits as told by your doctor. This is important. Contact a doctor if:  Medicine does not help your pain.  Your pain comes back.  You have new symptoms.  You have unusual discharge or bleeding from your vagina.  You have a fever or chills.  You are having trouble pooping (constipation).  You have blood in your pee (urine) or poop (stool).  Your pee smells bad.  You feel weak or light-headed. Get help right away if:  You have sudden pain that is very bad.  Your pain keeps getting worse.  You have very bad pain and also have any of these symptoms: ? A fever. ? Feeling sick to your stomach (nausea). ? Throwing up (vomiting). ? Being very sweaty.  You pass out (lose consciousness). Summary  Pelvic pain is pain in your lower belly (abdomen), below your belly button and between your hips.  There are many possible causes of pelvic pain.  Keep a journal of your pelvic pain. This information is not intended to replace advice given to you by your health care provider. Make sure you discuss any questions you have with your health care provider. Document  Released: 01/15/2008 Document Revised: 01/14/2018 Document Reviewed: 01/14/2018 Elsevier Patient Education  2020 Elsevier Inc.  

## 2019-04-08 NOTE — Assessment & Plan Note (Signed)
Discussed options. She is trying to avoid surgery. Trial of NSAIDS (celebrex for stomach protection) has nausea with toradol and diclofenac. Trial of OC's. Could consider endometrial ablation, but due to age, less likely to be effective.

## 2019-04-08 NOTE — Progress Notes (Signed)
   Subjective:    Patient ID: [name] is a 33 y.o. female presenting with Follow-up  on 04/08/2019  HPI: Notes painful cycles since she had her tubal she is noting pain in sides and lower abdomen.  Review of Systems  Constitutional: Negative for chills and fever.  Respiratory: Negative for shortness of breath.   Cardiovascular: Negative for chest pain.  Gastrointestinal: Negative for abdominal pain, nausea and vomiting.  Genitourinary: Negative for dysuria.  Skin: Negative for rash.      Objective:    BP 115/75   Pulse 86   Ht 4\' 11"  (1.499 m)   Wt 155 lb (70.3 kg)   LMP 03/14/2019 (Within Days)   Breastfeeding No   BMI 31.31 kg/m  Physical Exam Constitutional:      General: She is not in acute distress.    Appearance: She is well-developed.  HENT:     Head: Normocephalic and atraumatic.  Eyes:     General: No scleral icterus. Neck:     Musculoskeletal: Neck supple.  Cardiovascular:     Rate and Rhythm: Normal rate.  Pulmonary:     Effort: Pulmonary effort is normal.  Abdominal:     Palpations: Abdomen is soft.  Skin:    General: Skin is warm and dry.  Neurological:     Mental Status: She is alert and oriented to person, place, and time.         Assessment & Plan:   Problem List Items Addressed This Visit      Unprioritized   Pelvic pain - Primary    Discussed options. She is trying to avoid surgery. Trial of NSAIDS (celebrex for stomach protection) has nausea with toradol and diclofenac. Trial of OC's. Could consider endometrial ablation, but due to age, less likely to be effective.      Relevant Medications   celecoxib (CELEBREX) 200 MG capsule   norgestimate-ethinyl estradiol (ORTHO-CYCLEN) 0.25-35 MG-MCG tablet      Total face-to-face time with patient: 15 minutes. Over 50% of encounter was spent on counseling and coordination of care. Return in about 3 months (around 07/09/2019) for a follow-up.  [name] 04/08/2019 2:55 PM

## 2019-04-13 ENCOUNTER — Emergency Department (HOSPITAL_COMMUNITY)
Admission: EM | Admit: 2019-04-13 | Discharge: 2019-04-13 | Discharge status: Against Medical Advice | Payer: Medicaid Other

## 2019-04-13 NOTE — ED Notes (Signed)
Called twice for triage with no answer.

## 2019-04-13 NOTE — ED Notes (Signed)
Not answering in lobby

## 2019-04-13 NOTE — ED Notes (Signed)
Pt did not answer for triage. 

## 2019-05-12 ENCOUNTER — Other Ambulatory Visit: Payer: Self-pay | Admitting: Family Medicine

## 2019-05-12 DIAGNOSIS — R102 Pelvic and perineal pain: Secondary | ICD-10-CM

## 2019-05-14 ENCOUNTER — Other Ambulatory Visit: Payer: Self-pay | Admitting: Family Medicine

## 2019-05-14 DIAGNOSIS — M545 Low back pain, unspecified: Secondary | ICD-10-CM

## 2019-05-14 DIAGNOSIS — G8929 Other chronic pain: Secondary | ICD-10-CM

## 2019-07-22 ENCOUNTER — Encounter: Payer: Self-pay | Admitting: Family Medicine

## 2019-07-22 ENCOUNTER — Telehealth (INDEPENDENT_AMBULATORY_CARE_PROVIDER_SITE_OTHER): Payer: Medicaid Other | Admitting: Family Medicine

## 2019-07-22 DIAGNOSIS — R102 Pelvic and perineal pain: Secondary | ICD-10-CM | POA: Diagnosis not present

## 2019-07-22 MED ORDER — MEGESTROL ACETATE 40 MG PO TABS: 40.0000 mg | ORAL_TABLET | Freq: Two times a day (BID) | ORAL | 1 refills | Status: DC

## 2019-07-22 NOTE — Addendum Note (Signed)
Addended by: Donnamae Jude on: 07/22/2019 04:45 PM   Modules accepted: Orders

## 2019-07-22 NOTE — Progress Notes (Signed)
I connected with  [name] on 07/22/19 at 10:55 AM EST by telephone and verified that I am speaking with the correct person using two identifiers.   I discussed the limitations, risks, security and privacy concerns of performing an evaluation and management service by telephone and the availability of in person appointments. I also discussed with the patient that there may be a patient responsible charge related to this service. The patient expressed understanding and agreed to proceed.  Annabell Howells, RN 07/22/2019  10:32 AM

## 2019-07-22 NOTE — Assessment & Plan Note (Signed)
Since she has had a tubal ligation, will proceed with endometrial ablation. Can continue oral contraceptives until 4 weeks prior to ablation, then change to Megace twice daily to thin the lining. Risks of failure due to age discussed.  Risks include but are not limited to bleeding, infection, injury to surrounding structures, including bowel, bladder and ureters, blood clots, and death.  Likelihood of success is high.

## 2019-07-22 NOTE — Progress Notes (Addendum)
TELEHEALTH GYNECOLOGY VIRTUAL VIDEO VISIT ENCOUNTER NOTE  Provider location: [facility] for [facility] at [place]   I connected with [name] on 07/22/19 at 10:55 AM EST by MyChart Video Encounter at home and verified that I am speaking with the correct person using two identifiers.   I discussed the limitations, risks, security and privacy concerns of performing an evaluation and management service virtually and the availability of in person appointments. I also discussed with the patient that there may be a patient responsible charge related to this service. The patient expressed understanding and agreed to proceed.   History:  [name] is a 33 y.o. [phone] female being evaluated today for significant dysmenorrhea and bleeding since her tubal. She has been on oral contraceptives and Celebrex without significant improvement. Some better cycle control and pain is better with muscle relaxant.       Past Medical History:  Diagnosis Date  . Anemia   . GERD (gastroesophageal reflux disease)    with pregnancy only  - no med  . Preterm labor   . SVD (spontaneous vaginal delivery)    x 5   Past Surgical History:  Procedure Laterality Date  . LAPAROSCOPIC TUBAL LIGATION Bilateral 12/19/2015   Procedure: LAPAROSCOPIC TUBAL LIGATION with Sharmon Leyden Clips;  Surgeon: Lavonia Drafts, MD;  Location: [facility];  Service: Gynecology;  Laterality: Bilateral;  . WISDOM TOOTH EXTRACTION     ALL 4   The following portions of the patient's history were reviewed and updated as appropriate: allergies, current medications, past family history, past medical history, past social history, past surgical history and problem list.   Health Maintenance:  Normal pap and negative HRHPV on 06/11/2017.  Review of Systems:  Pertinent items noted in HPI and remainder of comprehensive ROS otherwise negative.  Physical Exam:   General:  Alert, oriented and cooperative. Patient appears to be  in no acute distress.  Mental Status: Normal mood and affect. Normal behavior. Normal judgment and thought content.   Respiratory: Normal respiratory effort, no problems with respiration noted  Rest of physical exam deferred due to type of encounter  Labs and Imaging No results found for this or any previous visit (from the past 336 hour(s)). No results found.     Assessment and Plan:     Problem List Items Addressed This Visit      Unprioritized   Pelvic pain    Since she has had a tubal ligation, will proceed with endometrial ablation. Can continue oral contraceptives until 4 weeks prior to ablation, then change to Megace twice daily to thin the lining. Risks of failure due to age discussed.  Risks include but are not limited to bleeding, infection, injury to surrounding structures, including bowel, bladder and ureters, blood clots, and death.  Likelihood of success is high.       Relevant Medications   megestrol (MEGACE) 40 MG tablet           I discussed the assessment and treatment plan with the patient. The patient was provided an opportunity to ask questions and all were answered. The patient agreed with the plan and demonstrated an understanding of the instructions.   The patient was advised to call back or seek an in-person evaluation/go to the ED if the symptoms worsen or if the condition fails to improve as anticipated.  I provided 15 minutes of face-to-face time during this encounter.   Donnamae Jude, MD [facility], [place]

## 2019-08-12 ENCOUNTER — Encounter

## 2019-08-19 ENCOUNTER — Other Ambulatory Visit: Payer: Self-pay | Admitting: Family Medicine

## 2019-08-19 DIAGNOSIS — G8929 Other chronic pain: Secondary | ICD-10-CM

## 2019-08-19 DIAGNOSIS — M545 Low back pain, unspecified: Secondary | ICD-10-CM

## 2019-09-21 NOTE — H&P (Signed)
[name] is an 34 y.o. [phone] female.   Chief Complaint: Pelvic pain HPI: Significant dysmenorrhea and bleeding since her tubal. She has been on oral contraceptives and Celebrex without significant improvement. Some better cycle control and pain is better with muscle relaxant.   Past Medical History:  Diagnosis Date  . Anemia   . GERD (gastroesophageal reflux disease)    with pregnancy only  - no med  . Preterm labor   . SVD (spontaneous vaginal delivery)    x 5    Past Surgical History:  Procedure Laterality Date  . LAPAROSCOPIC TUBAL LIGATION Bilateral 12/19/2015   Procedure: LAPAROSCOPIC TUBAL LIGATION with Anna Genre Clips;  Surgeon: Willodean Rosenthal, MD;  Location: [facility];  Service: Gynecology;  Laterality: Bilateral;  . WISDOM TOOTH EXTRACTION     ALL 4    Family History  Problem Relation Age of Onset  . Diabetes Maternal Aunt   . Diabetes Maternal Grandmother    Social History:  reports that she quit smoking about 4 years ago. Her smoking use included cigarettes. She smoked 0.25 packs per day. She has never used smokeless tobacco. She reports that she does not drink alcohol or use drugs.  Allergies: No Known Allergies  No medications prior to admission.    A comprehensive review of systems was negative.  General appearance: alert, cooperative and appears stated age Head: Normocephalic, without obvious abnormality, atraumatic Neck: supple, symmetrical, trachea midline Lungs: normal effort Heart: regular rate and rhythm Abdomen: soft, non-tender; bowel sounds normal; no masses,  no organomegaly Extremities: extremities normal, atraumatic, no cyanosis or edema Skin: Skin color, texture, turgor normal. No rashes or lesions Neurologic: Grossly normal   Lab Results  Component Value Date   WBC 8.8 08/25/2018   HGB 11.4 08/25/2018   HCT 34.8 08/25/2018   MCV 95 08/25/2018   PLT 353 08/25/2018   Lab Results  Component Value Date   PREGTESTUR NEGATIVE  08/25/2018   HCG <5.0 09/12/2017     Assessment/Plan Principal Problem:   Pelvic pain  For HTA Risks include but are not limited to bleeding, infection, injury to surrounding structures, including bowel, bladder and ureters, blood clots, and death.  Likelihood of success is high.    Reva Bores 09/21/2019, 2:30 PM

## 2019-09-22 ENCOUNTER — Telehealth: Payer: Self-pay | Admitting: *Deleted

## 2019-09-22 NOTE — Telephone Encounter (Signed)
[name] called and left a message this afternoon that she is supposed to have a procedure 09/29/19 and it says she has to get a covid test and wants to know what other tests she has to do.  Shelley Ascencio,RN  I called [name] and I explained if there are other tests she needs done she will be contacted by the Pre-op department. I explained it appears she is scheduled to have the covid testing done on 09/25/19 0915 at [facility]- will drive up.- so that the results come back before her surgery.  She voices understanding.  Shelley Augustus,RN

## 2019-09-23 ENCOUNTER — Encounter (HOSPITAL_BASED_OUTPATIENT_CLINIC_OR_DEPARTMENT_OTHER): Payer: Self-pay | Admitting: Family Medicine

## 2019-09-23 ENCOUNTER — Other Ambulatory Visit: Payer: Self-pay

## 2019-09-25 ENCOUNTER — Other Ambulatory Visit (HOSPITAL_COMMUNITY): Payer: Medicaid Other | Attending: Family Medicine

## 2019-09-27 ENCOUNTER — Other Ambulatory Visit (HOSPITAL_COMMUNITY)
Admission: RE | Admit: 2019-09-27 | Discharge: 2019-09-27 | Disposition: A | Discharge status: Home / Self Care | Payer: Medicaid Other | Source: Ambulatory Visit | Attending: Family Medicine | Admitting: Family Medicine

## 2019-09-27 DIAGNOSIS — Z20822 Contact with and (suspected) exposure to covid-19: Secondary | ICD-10-CM | POA: Diagnosis not present

## 2019-09-27 DIAGNOSIS — Z01812 Encounter for preprocedural laboratory examination: Secondary | ICD-10-CM | POA: Diagnosis not present

## 2019-09-27 LAB — SARS CORONAVIRUS 2 (TAT 6-24 HRS): SARS Coronavirus 2: NEGATIVE

## 2019-09-29 ENCOUNTER — Ambulatory Visit (HOSPITAL_BASED_OUTPATIENT_CLINIC_OR_DEPARTMENT_OTHER): Payer: Medicaid Other | Admitting: Anesthesiology

## 2019-09-29 ENCOUNTER — Encounter (HOSPITAL_BASED_OUTPATIENT_CLINIC_OR_DEPARTMENT_OTHER): Payer: Self-pay | Admitting: Family Medicine

## 2019-09-29 ENCOUNTER — Other Ambulatory Visit: Payer: Self-pay

## 2019-09-29 ENCOUNTER — Ambulatory Visit (HOSPITAL_BASED_OUTPATIENT_CLINIC_OR_DEPARTMENT_OTHER)
Admission: RE | Admit: 2019-09-29 | Discharge: 2019-09-29 | Disposition: A | Discharge status: Home / Self Care | Payer: Medicaid Other | Attending: Family Medicine | Admitting: Family Medicine

## 2019-09-29 ENCOUNTER — Encounter (HOSPITAL_BASED_OUTPATIENT_CLINIC_OR_DEPARTMENT_OTHER)
Admission: RE | Disposition: A | Discharge status: Home / Self Care | Payer: Self-pay | Source: Home / Self Care | Attending: Family Medicine

## 2019-09-29 DIAGNOSIS — N938 Other specified abnormal uterine and vaginal bleeding: Secondary | ICD-10-CM | POA: Diagnosis not present

## 2019-09-29 DIAGNOSIS — K219 Gastro-esophageal reflux disease without esophagitis: Secondary | ICD-10-CM | POA: Insufficient documentation

## 2019-09-29 DIAGNOSIS — Z833 Family history of diabetes mellitus: Secondary | ICD-10-CM | POA: Diagnosis not present

## 2019-09-29 DIAGNOSIS — Z87891 Personal history of nicotine dependence: Secondary | ICD-10-CM | POA: Diagnosis not present

## 2019-09-29 DIAGNOSIS — N920 Excessive and frequent menstruation with regular cycle: Secondary | ICD-10-CM | POA: Diagnosis not present

## 2019-09-29 DIAGNOSIS — R102 Pelvic and perineal pain: Secondary | ICD-10-CM | POA: Diagnosis not present

## 2019-09-29 HISTORY — PX: DILITATION & CURRETTAGE/HYSTROSCOPY WITH HYDROTHERMAL ABLATION: SHX5570

## 2019-09-29 LAB — POCT PREGNANCY, URINE: Preg Test, Ur: NEGATIVE

## 2019-09-29 LAB — POCT HEMOGLOBIN-HEMACUE: Hemoglobin: 11.6 g/dL — ABNORMAL LOW (ref 12.0–15.0)

## 2019-09-29 SURGERY — DILATATION & CURETTAGE/HYSTEROSCOPY WITH HYDROTHERMAL ABLATION
Anesthesia: General

## 2019-09-29 MED ORDER — PROPOFOL 10 MG/ML IV BOLUS
INTRAVENOUS | Status: DC | PRN
  Administered 2019-09-29: 200 mg via INTRAVENOUS

## 2019-09-29 MED ORDER — ONDANSETRON HCL 4 MG/2ML IJ SOLN
INTRAMUSCULAR | Status: DC | PRN
  Administered 2019-09-29: 4 mg via INTRAVENOUS

## 2019-09-29 MED ORDER — HYDROMORPHONE HCL 1 MG/ML IJ SOLN
0.2500 mg | INTRAMUSCULAR | Status: DC | PRN
  Administered 2019-09-29 (×3): 0.5 mg via INTRAVENOUS

## 2019-09-29 MED ORDER — PROMETHAZINE HCL 25 MG/ML IJ SOLN
6.2500 mg | Freq: Four times a day (QID) | INTRAMUSCULAR | Status: DC | PRN
  Administered 2019-09-29: 18:00:00 6.25 mg via INTRAVENOUS

## 2019-09-29 MED ORDER — HYDROMORPHONE HCL 1 MG/ML IJ SOLN
INTRAMUSCULAR | Status: AC
  Filled 2019-09-29: qty 0.5

## 2019-09-29 MED ORDER — FENTANYL CITRATE (PF) 100 MCG/2ML IJ SOLN
INTRAMUSCULAR | Status: DC | PRN
  Administered 2019-09-29 (×2): 50 ug via INTRAVENOUS

## 2019-09-29 MED ORDER — LIDOCAINE 2% (20 MG/ML) 5 ML SYRINGE
INTRAMUSCULAR | Status: DC | PRN
  Administered 2019-09-29: 60 mg via INTRAVENOUS

## 2019-09-29 MED ORDER — FENTANYL CITRATE (PF) 100 MCG/2ML IJ SOLN
INTRAMUSCULAR | Status: AC
  Filled 2019-09-29: qty 2

## 2019-09-29 MED ORDER — PROPOFOL 10 MG/ML IV BOLUS
INTRAVENOUS | Status: AC
  Filled 2019-09-29: qty 20

## 2019-09-29 MED ORDER — METOCLOPRAMIDE HCL 5 MG/ML IJ SOLN
INTRAMUSCULAR | Status: AC
  Filled 2019-09-29: qty 2

## 2019-09-29 MED ORDER — OXYCODONE HCL 5 MG PO TABS: 5.0000 mg | ORAL_TABLET | Freq: Four times a day (QID) | ORAL | 0 refills | Status: DC | PRN

## 2019-09-29 MED ORDER — GABAPENTIN 300 MG PO CAPS
300.0000 mg | ORAL_CAPSULE | ORAL | Status: AC
  Administered 2019-09-29: 13:00:00 300 mg via ORAL

## 2019-09-29 MED ORDER — SCOPOLAMINE 1 MG/3DAYS TD PT72
1.0000 | MEDICATED_PATCH | TRANSDERMAL | Status: DC
  Administered 2019-09-29: 1.5 mg via TRANSDERMAL

## 2019-09-29 MED ORDER — OXYCODONE HCL 5 MG/5ML PO SOLN: 5.0000 mg | Freq: Once | ORAL | Status: AC | PRN

## 2019-09-29 MED ORDER — ACETAMINOPHEN 500 MG PO TABS
1000.0000 mg | ORAL_TABLET | ORAL | Status: AC
  Administered 2019-09-29: 13:00:00 1000 mg via ORAL

## 2019-09-29 MED ORDER — KETOROLAC TROMETHAMINE 30 MG/ML IJ SOLN
INTRAMUSCULAR | Status: AC
  Filled 2019-09-29: qty 1

## 2019-09-29 MED ORDER — ONDANSETRON HCL 4 MG/2ML IJ SOLN
4.0000 mg | Freq: Once | INTRAMUSCULAR | Status: AC
  Administered 2019-09-29: 16:00:00 4 mg via INTRAVENOUS

## 2019-09-29 MED ORDER — SCOPOLAMINE 1 MG/3DAYS TD PT72
MEDICATED_PATCH | TRANSDERMAL | Status: AC
  Filled 2019-09-29: qty 1

## 2019-09-29 MED ORDER — GABAPENTIN 300 MG PO CAPS
ORAL_CAPSULE | ORAL | Status: AC
  Filled 2019-09-29: qty 1

## 2019-09-29 MED ORDER — LACTATED RINGERS IV SOLN: INTRAVENOUS | Status: DC

## 2019-09-29 MED ORDER — ONDANSETRON HCL 4 MG/2ML IJ SOLN
INTRAMUSCULAR | Status: AC
  Filled 2019-09-29: qty 2

## 2019-09-29 MED ORDER — MEPERIDINE HCL 25 MG/ML IJ SOLN: 6.2500 mg | INTRAMUSCULAR | Status: DC | PRN

## 2019-09-29 MED ORDER — OXYCODONE HCL 5 MG PO TABS
ORAL_TABLET | ORAL | Status: AC
  Filled 2019-09-29: qty 1

## 2019-09-29 MED ORDER — ACETAMINOPHEN 500 MG PO TABS
ORAL_TABLET | ORAL | Status: AC
  Filled 2019-09-29: qty 2

## 2019-09-29 MED ORDER — KETOROLAC TROMETHAMINE 30 MG/ML IJ SOLN
30.0000 mg | Freq: Once | INTRAMUSCULAR | Status: AC
  Administered 2019-09-29: 15:00:00 30 mg via INTRAVENOUS

## 2019-09-29 MED ORDER — ONDANSETRON HCL 4 MG/2ML IJ SOLN
4.0000 mg | Freq: Once | INTRAMUSCULAR | Status: AC
  Administered 2019-09-29: 17:00:00 4 mg via INTRAVENOUS

## 2019-09-29 MED ORDER — PROMETHAZINE HCL 25 MG/ML IJ SOLN: 6.2500 mg | INTRAMUSCULAR | Status: DC | PRN

## 2019-09-29 MED ORDER — DEXAMETHASONE SODIUM PHOSPHATE 10 MG/ML IJ SOLN
INTRAMUSCULAR | Status: AC
  Filled 2019-09-29: qty 1

## 2019-09-29 MED ORDER — PROMETHAZINE HCL 25 MG/ML IJ SOLN
INTRAMUSCULAR | Status: AC
  Filled 2019-09-29: qty 1

## 2019-09-29 MED ORDER — LIDOCAINE 2% (20 MG/ML) 5 ML SYRINGE
INTRAMUSCULAR | Status: AC
  Filled 2019-09-29: qty 5

## 2019-09-29 MED ORDER — MIDAZOLAM HCL 5 MG/5ML IJ SOLN
INTRAMUSCULAR | Status: DC | PRN
  Administered 2019-09-29: 2 mg via INTRAVENOUS

## 2019-09-29 MED ORDER — DEXAMETHASONE SODIUM PHOSPHATE 4 MG/ML IJ SOLN
INTRAMUSCULAR | Status: DC | PRN
  Administered 2019-09-29: 10 mg via INTRAVENOUS

## 2019-09-29 MED ORDER — LIDOCAINE HCL 1 % IJ SOLN
INTRAMUSCULAR | Status: DC | PRN
  Administered 2019-09-29: 20 mL

## 2019-09-29 MED ORDER — OXYCODONE HCL 5 MG PO TABS
5.0000 mg | ORAL_TABLET | Freq: Once | ORAL | Status: AC | PRN
  Administered 2019-09-29: 16:00:00 5 mg via ORAL

## 2019-09-29 MED ORDER — METOCLOPRAMIDE HCL 5 MG/ML IJ SOLN
10.0000 mg | Freq: Once | INTRAMUSCULAR | Status: AC
  Administered 2019-09-29: 10 mg via INTRAVENOUS

## 2019-09-29 MED ORDER — MIDAZOLAM HCL 2 MG/2ML IJ SOLN
INTRAMUSCULAR | Status: AC
  Filled 2019-09-29: qty 2

## 2019-09-29 SURGICAL SUPPLY — 11 items
GAUZE 4X4 16PLY RFD (DISPOSABLE) ×3 IMPLANT
GLOVE BIOGEL PI IND STRL 7.0 (GLOVE) ×2 IMPLANT
GLOVE BIOGEL PI INDICATOR 7.0 (GLOVE) ×4
GLOVE ECLIPSE 7.0 STRL STRAW (GLOVE) ×3 IMPLANT
GOWN STRL REUS W/TWL LRG LVL3 (GOWN DISPOSABLE) ×6 IMPLANT
PACK VAGINAL MINOR WOMEN LF (CUSTOM PROCEDURE TRAY) ×3 IMPLANT
PAD OB MATERNITY 4.3X12.25 (PERSONAL CARE ITEMS) ×3 IMPLANT
PAD PREP 24X48 CUFFED NSTRL (MISCELLANEOUS) ×3 IMPLANT
SET GENESYS HTA PROCERVA (MISCELLANEOUS) ×3 IMPLANT
SLEEVE SCD COMPRESS KNEE MED (MISCELLANEOUS) ×3 IMPLANT
TOWEL GREEN STERILE FF (TOWEL DISPOSABLE) ×6 IMPLANT

## 2019-09-29 NOTE — Interval H&P Note (Signed)
History and Physical Interval Note:  09/29/2019 12:30 PM  [name]  has presented today for surgery, with the diagnosis of DUB.  The various methods of treatment have been discussed with the patient and family. After consideration of risks, benefits and other options for treatment, the patient has consented to  Procedure(s): DILATATION & CURETTAGE/HYSTEROSCOPY WITH HYDROTHERMAL ABLATION (N/A) as a surgical intervention.  The patient's history has been reviewed, patient examined, no change in status, stable for surgery.  I have reviewed the patient's chart and labs.  Questions were answered to the patient's satisfaction.     Reva Bores

## 2019-09-29 NOTE — Transfer of Care (Signed)
Immediate Anesthesia Transfer of Care Note  Patient: [name]  Procedure(s) Performed: DILATATION & CURETTAGE/HYSTEROSCOPY WITH HYDROTHERMAL ABLATION (N/A )  Patient Location: PACU  Anesthesia Type:General  Level of Consciousness: awake  Airway & Oxygen Therapy: Patient Spontanous Breathing and Patient connected to face mask oxygen  Post-op Assessment: Report given to RN and Post -op Vital signs reviewed and stable  Post vital signs: Reviewed and stable  Last Vitals:  Vitals Value Taken Time  BP    Temp    Pulse    Resp    SpO2      Last Pain:  Vitals:   09/29/19 1218  TempSrc: Oral  PainSc: 5       Patients Stated Pain Goal: 3 (09/29/19 1218)  Complications: No apparent anesthesia complications

## 2019-09-29 NOTE — Anesthesia Preprocedure Evaluation (Signed)
Anesthesia Evaluation  Patient identified by MRN, date of birth, ID band Patient awake    Reviewed: Allergy & Precautions, NPO status , Patient's Chart, lab work & pertinent test results  Airway Mallampati: II  TM Distance: >3 FB Neck ROM: Full    Dental  (+) Teeth Intact, Dental Advisory Given   Pulmonary Patient abstained from smoking., former smoker,    breath sounds clear to auscultation       Cardiovascular  Rhythm:Regular Rate:Normal     Neuro/Psych    GI/Hepatic GERD  ,  Endo/Other    Renal/GU      Musculoskeletal   Abdominal   Peds  Hematology  (+) anemia ,   Anesthesia Other Findings   Reproductive/Obstetrics                             Anesthesia Physical  Anesthesia Plan  ASA: I  Anesthesia Plan: General   Post-op Pain Management:    Induction: Intravenous  PONV Risk Score and Plan: 4 or greater and Ondansetron, Dexamethasone, Midazolam, Scopolamine patch - Pre-op and Treatment may vary due to age or medical condition  Airway Management Planned: LMA  Additional Equipment: None  Intra-op Plan:   Post-operative Plan: Extubation in OR  Informed Consent: I have reviewed the patients History and Physical, chart, labs and discussed the procedure including the risks, benefits and alternatives for the proposed anesthesia with the patient or authorized representative who has indicated his/her understanding and acceptance.     Dental advisory given  Plan Discussed with: CRNA  Anesthesia Plan Comments:        Anesthesia Quick Evaluation

## 2019-09-29 NOTE — Anesthesia Postprocedure Evaluation (Signed)
Anesthesia Post Note  Patient: [name]  Procedure(s) Performed: DILATATION & CURETTAGE/HYSTEROSCOPY WITH HYDROTHERMAL ABLATION (N/A )     Patient location during evaluation: PACU Anesthesia Type: General Level of consciousness: sedated and patient cooperative Pain management: pain level controlled Vital Signs Assessment: post-procedure vital signs reviewed and stable Respiratory status: spontaneous breathing Cardiovascular status: stable Anesthetic complications: no    Last Vitals:  Vitals:   09/29/19 1616 09/29/19 1715  BP: 140/85 139/84  Pulse: 71 70  Resp: 18 18  Temp: 37 C   SpO2: 100% 100%    Last Pain:  Vitals:   09/29/19 1713  TempSrc:   PainSc: 6                  Lewie Loron

## 2019-09-29 NOTE — Discharge Instructions (Signed)
Endometrial Ablation, Care After This sheet gives you information about how to care for yourself after your procedure. Your health care provider may also give you more specific instructions. If you have problems or questions, contact your health care provider. What can I expect after the procedure? After the procedure, it is common to have:  A need to urinate more frequently than usual for the first 24 hours.  Cramps similar to menstrual cramps. These may last for 1-2 days.  Thin, watery vaginal discharge that is light pink or brown in color. This may last a few weeks. Discharge will be heavy for the first few days after your procedure. You may need to wear a sanitary pad.  Nausea.  Vaginal bleeding for 4-6 weeks after the procedure, as tissue healing occurs. Follow these instructions at home: Activity  Do not drive for 24 hours if you were given amedicine to help you relax (sedative) during your procedure.  Do not have sex or put anything into your vagina until your health care provider approves.  Do not lift anything that is heavier than 10 lb (4.5 kg), or the limit that you are told, until your health care provider says that it is safe.  Return to your normal activities as told by your health care provider. Ask your health care provider what activities are safe for you. General instructions   Take over-the-counter and prescription medicines only as told by your health care provider.  Do not take baths, swim, or use a hot tub until your health care provider approves. You will be able to take showers.  Check your vaginal area every day for signs of infection. Check for: ? Redness, swelling, or pain. ? More discharge or blood, instead of less. ? Bad-smelling discharge.  Keep all follow-up visits as told by your health care provider. This is important.  Drink enough fluid to keep your urine pale yellow. Contact a health care provider if you have:  Vaginal redness, swelling, or  pain.  Vaginal discharge or bleeding that gets worse instead of getting better.  Bad-smelling vaginal discharge.  A fever or chills.  Trouble urinating. Get help right away if you have:  Heavy vaginal bleeding.  Severe cramps. Summary  After endometrial ablation, it is normal to have thin, watery vaginal discharge that is light pink or brown in color. This may last a few weeks and may be heavier right after the procedure.  Vaginal bleeding is also normal after the procedure and should get better with time.  Check your vaginal area every day for signs of infection, such as bad-smelling discharge.  Keep all follow-up visits as told by your health care provider. This is important. This information is not intended to replace advice given to you by your health care provider. Make sure you discuss any questions you have with your health care provider. Document Revised: 11/19/2018 Document Reviewed: 06/10/2017 Elsevier Patient Education  [place] 6:38 PM   NO ADVIL/ IBUPROFEN  UNTIL TOMORROW IF OK WITH YOUR DOCTOR    Post Anesthesia Home Care Instructions  Activity: Get plenty of rest for the remainder of the day. A responsible individual must stay with you for 24 hours following the procedure.  For the next 24 hours, DO NOT: -Drive a car -Paediatric nurse -Drink alcoholic beverages -Take any medication unless instructed by your physician -Make any legal decisions or sign important papers.  Meals: Start with liquid foods such as gelatin or soup.  Progress to regular foods as tolerated. Avoid greasy, spicy, heavy foods. If nausea and/or vomiting occur, drink only clear liquids until the nausea and/or vomiting subsides. Call your physician if vomiting continues.  Special Instructions/Symptoms: Your throat may feel dry or sore from the anesthesia or the breathing tube placed in your throat during surgery. If this causes discomfort, gargle  with warm salt water. The discomfort should disappear within 24 hours.  If you had a scopolamine patch placed behind your ear for the management of post- operative nausea and/or vomiting:  1. The medication in the patch is effective for 72 hours, after which it should be removed.  Wrap patch in a tissue and discard in the trash. Wash hands thoroughly with soap and water. 2. You may remove the patch earlier than 72 hours if you experience unpleasant side effects which may include dry mouth, dizziness or visual disturbances. 3. Avoid touching the patch. Wash your hands with soap and water after contact with the patch.  Can take pain medication (oxycodone) again at 10:30pm.

## 2019-09-29 NOTE — Anesthesia Procedure Notes (Signed)
Procedure Name: LMA Insertion Date/Time: 09/29/2019 12:48 PM Performed by: Caren Macadam, CRNA Pre-anesthesia Checklist: Patient identified, Emergency Drugs available, Suction available and Patient being monitored Patient Re-evaluated:Patient Re-evaluated prior to induction Oxygen Delivery Method: Circle system utilized Preoxygenation: Pre-oxygenation with 100% oxygen Induction Type: IV induction Ventilation: Mask ventilation without difficulty LMA: LMA inserted LMA Size: 4.0 Number of attempts: 1 Placement Confirmation: positive ETCO2 and breath sounds checked- equal and bilateral Tube secured with: Tape Dental Injury: Teeth and Oropharynx as per pre-operative assessment

## 2019-09-29 NOTE — Op Note (Signed)
Preoperative diagnosis: Menorrhagia and pelvic pain  Postoperative diagnosis: Menorrhagia and pelvic pain  Procedure: HTA endometrial ablation, hysteroscopy, D & C  Surgeon: Shelbie Proctor. Shawnie Pons, M.D.  Anesthesia: GETT  Findings: Normal appearing cervix and uterine cavity with both ostia seen.  Estimated blood loss: Minimal  Specimens: Endometrial curettings  Disposition of specimen: Pathology  Reason for procedure: Patient is a 34 y.o. [id number] with abnormal bleeding following BTL.   Procedure: Patient was taken to the OR where she was placed in dorsal lithotomy in Allen stirrups. SCDs were in place. An adequate timeout was obtained. The patient was prepped and draped in the usual sterile fashion. A speculum was placed inside the vagina and the cervix visualized. The cervix was grasped anteriorly with a single-tooth tenaculum. 20 cc of quarter percent Marcaine were injected for paracervical block. The uterus sounded to 10 cm. Sequential dilation was done to a #8 dilator, and the HTA with hysteroscope was introduced into the uterine cavity. The cervix and endometrial lining appeared normal both ostia were seen there was no deformity of the cavity. A seal test was done and was passed. The uterine cavity was heated to between 80 and 90C and HTA was performed for 10 minutes. Cooling was then performed and the instrument removed. Sharp curettage was then performed and sample sent to pathology. All instrument, needle, and lap counts were correct x 2. The patient was awakened taken to recovery room in stable condition.  Reva Bores MD 09/29/2019 1:24 PM

## 2019-09-30 ENCOUNTER — Encounter: Payer: Self-pay | Admitting: *Deleted

## 2019-09-30 LAB — SURGICAL PATHOLOGY

## 2019-10-04 ENCOUNTER — Telehealth: Payer: Self-pay | Admitting: Family Medicine

## 2019-10-04 ENCOUNTER — Other Ambulatory Visit: Payer: Self-pay | Admitting: Family Medicine

## 2019-10-04 ENCOUNTER — Other Ambulatory Visit: Payer: Self-pay

## 2019-10-04 NOTE — Telephone Encounter (Signed)
Patient is requesting a call back to see about getting Dr Shawnie Pons to call her in some medications for this pain she is having.

## 2019-10-04 NOTE — Telephone Encounter (Signed)
Pt left additional message stating that she is having tremendous pain in lower abdomen and pelvic region. She is requesting a refill of pain medicine or muscle relaxer. I returned pt's call and left a message stating that a message was sent to Dr. Shawnie Pons regarding her request. In the meantime, pt may take OTC ibuprofen 600 mg every 6 hrs as needed for pain.

## 2019-10-05 MED ORDER — OXYCODONE HCL 5 MG PO TABS: 5.0000 mg | ORAL_TABLET | Freq: Four times a day (QID) | ORAL | 0 refills | Status: DC | PRN

## 2019-10-11 ENCOUNTER — Telehealth: Payer: Self-pay | Admitting: Family Medicine

## 2019-10-11 ENCOUNTER — Telehealth (INDEPENDENT_AMBULATORY_CARE_PROVIDER_SITE_OTHER): Payer: Self-pay | Admitting: Family Medicine

## 2019-10-11 ENCOUNTER — Encounter: Payer: Self-pay | Admitting: Family Medicine

## 2019-10-11 ENCOUNTER — Encounter: Payer: Self-pay | Admitting: Obstetrics & Gynecology

## 2019-10-11 ENCOUNTER — Other Ambulatory Visit: Payer: Self-pay

## 2019-10-11 DIAGNOSIS — Z09 Encounter for follow-up examination after completed treatment for conditions other than malignant neoplasm: Secondary | ICD-10-CM

## 2019-10-11 MED ORDER — PROMETHAZINE HCL 12.5 MG PO TABS: 12.5000 mg | ORAL_TABLET | Freq: Four times a day (QID) | ORAL | 0 refills | Status: DC | PRN

## 2019-10-11 NOTE — Telephone Encounter (Signed)
The patient called in stating Dr. Shawnie Pons took her out of work until her next doctor's appointment. However there is no indication in the summary. Can you please review and send message to front office for the dates she can be taken out of work.

## 2019-10-11 NOTE — Progress Notes (Signed)
TELEHEALTH GYNECOLOGY VIRTUAL VIDEO VISIT ENCOUNTER NOTE  Provider location: [facility] [employer] [place]   I connected with [name] on 10/11/19 at  9:15 AM EST by MyChart Video Encounter at home and verified that I am speaking with the correct person using two identifiers.   I discussed the limitations, risks, security and privacy concerns of performing an evaluation and management service virtually and the availability of in person appointments. I also discussed with the patient that there may be a patient responsible charge related to this service. The patient expressed understanding and agreed to proceed.   History:  [name] is a 33 y.o. [phone] female being evaluated today for postop check. She underwent a D & C, hysteroscopy and HTA on 2/17.  Feels like she is having cramping and stabbing and like she is coming on her cycle. Has normal expected discharge. Notes she is having headache, nausea. Also with a funny thing happening in her leg. This started 2 days after surgery. Still with diarrhea and vomiting. Denies fever.   Past Medical History:  Diagnosis Date   Anemia    GERD (gastroesophageal reflux disease)    with pregnancy only  - no med   Preterm labor    SVD (spontaneous vaginal delivery)    x 5   Past Surgical History:  Procedure Laterality Date   DILITATION & CURRETTAGE/HYSTROSCOPY WITH HYDROTHERMAL ABLATION N/A 09/29/2019   Procedure: DILATATION & CURETTAGE/HYSTEROSCOPY WITH HYDROTHERMAL ABLATION;  Surgeon: Reva Bores, MD;  Location: [facility];  Service: Gynecology;  Laterality: N/A;   LAPAROSCOPIC TUBAL LIGATION Bilateral 12/19/2015   Procedure: LAPAROSCOPIC TUBAL LIGATION with Anna Genre Clips;  Surgeon: Willodean Rosenthal, MD;  Location: [facility];  Service: Gynecology;  Laterality: Bilateral;   WISDOM TOOTH EXTRACTION     ALL 4   The following portions of the patient's history were reviewed and updated as  appropriate: allergies, current medications, past family history, past medical history, past social history, past surgical history and problem list.   Health Maintenance:  Normal pap and negative HRHPV on 06/11/2017.   Review of Systems:  Pertinent items noted in HPI and remainder of comprehensive ROS otherwise negative.  Physical Exam:   General:  Alert, oriented and cooperative. Patient appears to be in no acute distress.  Mental Status: Normal mood and affect. Normal behavior. Normal judgment and thought content.   Respiratory: Normal respiratory effort, no problems with respiration noted  Rest of physical exam deferred due to type of encounter  Labs and Imaging Results for orders placed or performed during the hospital encounter of 09/29/19 (from the past 336 hour(s))  Pregnancy, urine POC   Collection Time: 09/29/19 12:06 PM  Result Value Ref Range   Preg Test, Ur NEGATIVE NEGATIVE  Hemoglobin-hemacue, POC   Collection Time: 09/29/19 12:30 PM  Result Value Ref Range   Hemoglobin 11.6 (L) 12.0 - 15.0 g/dL  Surgical pathology   Collection Time: 09/29/19  1:33 PM  Result Value Ref Range   SURGICAL PATHOLOGY      SURGICAL PATHOLOGY CASE: [id number] PATIENT: Lakeitha Clodfelter Surgical Pathology Report     Clinical History: DUB (cm)     FINAL MICROSCOPIC DIAGNOSIS:  A. ENDOMETRIUM, CURETTAGE: - Fragments of benign endometrial polyp, see comment - Benign proliferative endometrium - Negative for hyperplasia or malignancy   COMMENT:  Dr. Rayetta Pigg reviewed the case and concurs with the diagnosis.    GROSS DESCRIPTION:  Received in formalin is a 2.2 x  1.8 x 0.5 cm aggregate of pink-white to red-brown soft, friable tissue/material, entirely submitted in 2 blocks.  SW 09/29/2019    Final Diagnosis performed by Jaquita Folds, MD.   Electronically signed 09/30/2019 Technical and / or Professional components performed at [facility]. [facility] [place] [address] [place], [place] [address].  Immunohistochemistry Technical component (if applicable) was performed at [facility]. [address] [place], [place], [place] [address].   [place] (if applicable): Some of these immunohistochemical stains may have been developed and the performance characteristics determine by [facility]. Some may not have been cleared or approved by the U.S. Food and Drug Administration. The FDA has determined that such clearance or approval is not necessary. This test is used for clinical purposes. It should not be regarded as investigational or for research. This laboratory is certified under the [employer] (CLIA-88) as qualified to perform high complexity clinical laboratory testing.  The controls stained appropriately.   Results for orders placed or performed during the hospital encounter of 09/27/19 (from the past 336 hour(s))  SARS CORONAVIRUS 2 (TAT 6-24 HRS) Nasopharyngeal Nasopharyngeal Swab   Collection Time: 09/27/19  2:10 PM   Specimen: Nasopharyngeal Swab  Result Value Ref Range   SARS Coronavirus 2 NEGATIVE NEGATIVE   No results found.     Assessment and Plan:  1. Postop check cramping and nausea--continue celebrex, phenergan added. Will see in office in 2 wks. Out of work until then.-     I discussed the assessment and treatment plan with the patient. The patient was provided an opportunity to ask questions and all were answered. The patient agreed with the plan and demonstrated an understanding of the instructions.   The patient was advised to call back or seek an in-person evaluation/go to the ED if the symptoms worsen or if the condition fails to improve as anticipated.  I provided 11 minutes of face-to-face time during this encounter.   Donnamae Jude, MD [facility] for [facility], [place]

## 2019-10-11 NOTE — Progress Notes (Signed)
I connected with  [name] on 10/11/19 at  9:15 AM EST by telephone and verified that I am speaking with the correct person using two identifiers.   I discussed the limitations, risks, security and privacy concerns of performing an evaluation and management service by telephone and the availability of in person appointments. I also discussed with the patient that there may be a patient responsible charge related to this service. The patient expressed understanding and agreed to proceed.  Elevated phq9, likely related to pain- Dr Shawnie Pons informed.  Marylynn Pearson, RN 10/11/2019  9:12 AM

## 2019-10-11 NOTE — Telephone Encounter (Signed)
Per chart review, Dr. Shawnie Pons wanted pt to remain out of work until next office visit which is 10/27/19. Letter was composed by [employer] and saved to pt chart stating that she is to be out of work until 10/28/19.

## 2019-10-27 ENCOUNTER — Other Ambulatory Visit: Payer: Self-pay

## 2019-10-27 ENCOUNTER — Ambulatory Visit: Payer: Medicaid Other | Admitting: Family Medicine

## 2019-10-27 ENCOUNTER — Other Ambulatory Visit (HOSPITAL_COMMUNITY)
Admission: RE | Admit: 2019-10-27 | Discharge: 2019-10-27 | Disposition: A | Discharge status: Home / Self Care | Payer: Medicaid Other | Source: Ambulatory Visit | Attending: Family Medicine | Admitting: Family Medicine

## 2019-10-27 ENCOUNTER — Encounter: Payer: Self-pay | Admitting: Family Medicine

## 2019-10-27 VITALS — BP 109/74 | HR 93 | Ht 59.0 in | Wt 159.0 lb

## 2019-10-27 DIAGNOSIS — Z113 Encounter for screening for infections with a predominantly sexual mode of transmission: Secondary | ICD-10-CM

## 2019-10-27 DIAGNOSIS — N719 Inflammatory disease of uterus, unspecified: Secondary | ICD-10-CM

## 2019-10-27 DIAGNOSIS — B9689 Other specified bacterial agents as the cause of diseases classified elsewhere: Secondary | ICD-10-CM | POA: Diagnosis not present

## 2019-10-27 DIAGNOSIS — Z124 Encounter for screening for malignant neoplasm of cervix: Secondary | ICD-10-CM

## 2019-10-27 DIAGNOSIS — Z01419 Encounter for gynecological examination (general) (routine) without abnormal findings: Secondary | ICD-10-CM | POA: Diagnosis not present

## 2019-10-27 DIAGNOSIS — Z09 Encounter for follow-up examination after completed treatment for conditions other than malignant neoplasm: Secondary | ICD-10-CM | POA: Diagnosis not present

## 2019-10-27 DIAGNOSIS — Z01411 Encounter for gynecological examination (general) (routine) with abnormal findings: Secondary | ICD-10-CM | POA: Diagnosis not present

## 2019-10-27 LAB — POCT URINALYSIS DIP (DEVICE)
Bilirubin Urine: NEGATIVE
Glucose, UA: NEGATIVE mg/dL
Hgb urine dipstick: NEGATIVE
Ketones, ur: NEGATIVE mg/dL
Nitrite: NEGATIVE
Protein, ur: NEGATIVE mg/dL
Specific Gravity, Urine: 1.02 (ref 1.005–1.030)
Urobilinogen, UA: 1 mg/dL (ref 0.0–1.0)
pH: 6 (ref 5.0–8.0)

## 2019-10-27 MED ORDER — DOXYCYCLINE HYCLATE 100 MG PO CAPS: 100.0000 mg | ORAL_CAPSULE | Freq: Two times a day (BID) | ORAL | 0 refills | Status: DC

## 2019-10-27 NOTE — Patient Instructions (Signed)
Endometritis  Endometritis is irritation, soreness, or inflammation that affects the lining of the uterus (endometrium). Infection is usually the cause of endometritis. It is important to get treatment to prevent complications. Common complications may include more severe infections and not being able to have children(infertility). What are the causes? This condition may be caused by:  Bacterial infections.  STIs (sexually transmitted infections).  A miscarriage or childbirth, especially after a long labor or cesarean delivery.  Certain gynecological procedures. These may include dilation and curettage (D&C), hysteroscopy, or birth control (contraceptive) insertion.  Tuberculosis (TB). What are the signs or symptoms? Symptoms of this condition include:  Fever.  Lower abdomen (abdominal) pain.  Pelvis (pelvic) pain.  Abnormal vaginal discharge or bleeding.  Abdominal bloating (distention) or swelling.  General discomfort or generally feeling ill.  Discomfort with bowel movements.  Constipation. How is this diagnosed? This condition may be diagnosed based on:  A physical exam, including a pelvic exam.  Tests, such as: ? Blood tests. ? Removal of a sample of endometrial tissue for testing (endometrial biopsy). ? Examining a sample of vaginal discharge under a microscope (wet prep). ? Removal of a sample of fluid from the cervix for testing (cervical culture). ? Surgical examination of the pelvis and abdomen. How is this treated? This condition is treated with:  Antibiotic medicines.  For more severe cases, hospitalization may be needed to give fluids and antibiotics directly into a vein through an IV tube. Follow these instructions at home:  Take over-the-counter and prescription medicines only as told by your health care provider.  Drink enough fluid to keep your urine clear or pale yellow.  Take your antibiotic medicine as told by your health care provider. Do  not stop taking the antibiotic even if you start to feel better.  Do not douche or have sex (including vaginal, oral, and anal sex) until your health care provider approves.  If your endometritis was caused by an STI, do not have sex (including vaginal, oral, and anal sex) until your partner has also been treated for the STI.  Return to your normal activities as told by your health care provider. Ask your health care provider what activities are safe for you.  Keep all follow-up visits as told by your health care provider. This is important. Contact a health care provider if:  You have pain that does not get better with medicine.  You have a fever.  You have pain with bowel movements. Get help right away if:  You have abdominal swelling.  You have abdominal pain that gets worse.  You have bad-smelling vaginal discharge, or an increased amount of vaginal discharge.  You have abnormal vaginal bleeding.  You have nausea and vomiting. Summary  Endometritis affects the lining of the uterus (endometrium) and is usually caused by an infection.  It is important to get treatment to prevent complications.  You have several treatment options for endometritis. Treatment may include antibiotics and IV fluids.  Take your antibiotic medicine as told by your health care provider. Do not stop taking the antibiotic even if you start to feel better.  Do not douche or have sex (including vaginal, oral, and anal sex) until your health care provider approves. This information is not intended to replace advice given to you by your health care provider. Make sure you discuss any questions you have with your health care provider. Document Revised: 07/11/2017 Document Reviewed: 08/13/2016 Elsevier Patient Education  2020 Elsevier Inc.  

## 2019-10-27 NOTE — Progress Notes (Signed)
   Subjective:    Patient ID: [name] is a 34 y.o. female presenting with Routine Post Op  on 10/27/2019  HPI: S/p HTA on 2/17. Still having symptoms.Pain is continuing. Having headaches. Similar to when pregnant. Having some dizziness.  Review of Systems  Constitutional: Negative for chills and fever.  Respiratory: Negative for shortness of breath.   Cardiovascular: Negative for chest pain.  Gastrointestinal: Negative for abdominal pain, nausea and vomiting.  Genitourinary: Negative for dysuria.  Skin: Negative for rash.      Objective:    BP 109/74   Pulse 93   Ht 4\' 11"  (1.499 m)   Wt 159 lb (72.1 kg)   BMI 32.11 kg/m  Physical Exam Exam conducted with a chaperone present.  Constitutional:      General: She is not in acute distress.    Appearance: She is well-developed.  HENT:     Head: Normocephalic and atraumatic.  Eyes:     General: No scleral icterus. Cardiovascular:     Rate and Rhythm: Normal rate.  Pulmonary:     Effort: Pulmonary effort is normal.  Abdominal:     Palpations: Abdomen is soft.  Genitourinary:    Comments: BUS normal, vagina is pink and rugated, cervix is parous without lesion, uterus is small and anteverted and diffusely tender, no adnexal mass   Musculoskeletal:     Cervical back: Neck supple.  Skin:    General: Skin is warm and dry.  Neurological:     Mental Status: She is alert and oriented to person, place, and time.         Assessment & Plan:   Problem List Items Addressed This Visit    None    Visit Diagnoses    Screen for STD (sexually transmitted disease)    -  Primary   Relevant Orders   HIV Antibody (routine testing w rflx)   RPR   Hepatitis B surface antigen   Hepatitis C antibody   Screening for cervical cancer       Relevant Orders   Cytology - PAP( [place])   Encounter for gynecological examination with abnormal finding       Relevant Orders   TSH   CBC   Comprehensive metabolic panel   Endometritis       trial of doxycycline   Relevant Medications   doxycycline (VIBRAMYCIN) 100 MG capsule   Postop check          Total time: 30 minutes.  Return in about 3 months (around 01/27/2020), or if symptoms worsen or fail to improve, for virtual.  01/29/2020 10/27/2019 5:01 PM

## 2019-10-28 ENCOUNTER — Other Ambulatory Visit: Payer: Self-pay | Admitting: Family Medicine

## 2019-10-28 DIAGNOSIS — M545 Low back pain, unspecified: Secondary | ICD-10-CM

## 2019-10-28 DIAGNOSIS — G8929 Other chronic pain: Secondary | ICD-10-CM

## 2019-10-29 ENCOUNTER — Other Ambulatory Visit: Payer: Self-pay | Admitting: Family Medicine

## 2019-10-29 LAB — CYTOLOGY - PAP
Chlamydia: NEGATIVE
Comment: NEGATIVE
Comment: NEGATIVE
Comment: NORMAL
Diagnosis: NEGATIVE
High risk HPV: NEGATIVE
Neisseria Gonorrhea: NEGATIVE

## 2019-10-29 MED ORDER — METRONIDAZOLE 500 MG PO TABS: 1000.0000 mg | ORAL_TABLET | Freq: Two times a day (BID) | ORAL | 1 refills | Status: AC

## 2019-11-02 LAB — COMPREHENSIVE METABOLIC PANEL
ALT: 9 IU/L (ref 0–32)
AST: 15 IU/L (ref 0–40)
Albumin/Globulin Ratio: 1.3 (ref 1.2–2.2)
Albumin: 4.3 g/dL (ref 3.8–4.8)
Alkaline Phosphatase: 62 IU/L (ref 39–117)
BUN/Creatinine Ratio: 13 (ref 9–23)
BUN: 8 mg/dL (ref 6–20)
Bilirubin Total: <0.2 mg/dL (ref 0.0–1.2)
CO2: 27 mmol/L (ref 20–29)
Calcium: 9.6 mg/dL (ref 8.7–10.2)
Chloride: 102 mmol/L (ref 96–106)
Creatinine, Ser: 0.64 mg/dL (ref 0.57–1.00)
GFR calc Af Amer: 136 mL/min/{1.73_m2} (ref >59)
GFR calc non Af Amer: 118 mL/min/{1.73_m2} (ref >59)
Globulin, Total: 3.2 g/dL (ref 1.5–4.5)
Glucose: 73 mg/dL (ref 65–99)
Potassium: 4.4 mmol/L (ref 3.5–5.2)
Sodium: 139 mmol/L (ref 134–144)
Total Protein: 7.5 g/dL (ref 6.0–8.5)

## 2019-11-02 LAB — CBC
Hematocrit: 34.6 % (ref 34.0–46.6)
Hemoglobin: 11.4 g/dL (ref 11.1–15.9)
MCH: 31.2 pg (ref 26.6–33.0)
MCHC: 32.9 g/dL (ref 31.5–35.7)
MCV: 95 fL (ref 79–97)
Platelets: 433 10*3/uL (ref 150–450)
RBC: 3.65 x10E6/uL — ABNORMAL LOW (ref 3.77–5.28)
RDW: 11.2 % — ABNORMAL LOW (ref 11.7–15.4)
WBC: 11.5 10*3/uL — ABNORMAL HIGH (ref 3.4–10.8)

## 2019-11-02 LAB — TSH: TSH: 1.26 u[IU]/mL (ref 0.450–4.500)

## 2019-11-02 LAB — HEPATITIS B SURFACE ANTIGEN: Hepatitis B Surface Ag: NEGATIVE

## 2019-11-02 LAB — HIV ANTIBODY (ROUTINE TESTING W REFLEX): HIV Screen 4th Generation wRfx: NONREACTIVE

## 2019-11-02 LAB — HEPATITIS C ANTIBODY: Hep C Virus Ab: <0.1 s/co ratio (ref 0.0–0.9)

## 2019-11-02 LAB — RPR: RPR Ser Ql: NONREACTIVE

## 2019-11-17 ENCOUNTER — Telehealth: Payer: Self-pay

## 2019-11-17 DIAGNOSIS — Z0000 Encounter for general adult medical examination without abnormal findings: Secondary | ICD-10-CM

## 2019-11-17 DIAGNOSIS — R079 Chest pain, unspecified: Secondary | ICD-10-CM

## 2019-11-17 NOTE — Telephone Encounter (Addendum)
Pt called and stated that she had a mild surgery in February and when she goes back to work she will need to lift 40 lbs.  Pt states that she needs a letter stating that she has lift restrictions.  Called pt and pt informed me that her employment needed a note stating that she can not lift 40lbs.  Pt also states that the medication she was prescribed makes her nauseous however she has only 5 pills left of the antibiotic.  Pt also reports having chest pain.  I advised pt that she needed to be evaluated for the chest pain and that she should go to her PCP or Urgent Care.  Pt states that it is the chest pain that causes her to issues with lifting.  I informed pt that chest pain is not a gyn issue and that a primary care physician would be best to manage her.  Pt states that she does not have a PCP.  I explained to the pt that I would send a referral to [facility] and send her their information to f/u if she has not heard back from them in about week.  Pt verbalized understanding.  Addison Naegeli, RN

## 2019-11-19 DIAGNOSIS — R079 Chest pain, unspecified: Secondary | ICD-10-CM | POA: Diagnosis not present

## 2019-11-19 DIAGNOSIS — R1010 Upper abdominal pain, unspecified: Secondary | ICD-10-CM | POA: Diagnosis not present

## 2019-11-19 DIAGNOSIS — I499 Cardiac arrhythmia, unspecified: Secondary | ICD-10-CM | POA: Diagnosis not present

## 2019-11-19 DIAGNOSIS — R1013 Epigastric pain: Secondary | ICD-10-CM | POA: Diagnosis not present

## 2019-11-19 DIAGNOSIS — R0789 Other chest pain: Secondary | ICD-10-CM | POA: Diagnosis not present

## 2019-11-19 DIAGNOSIS — R112 Nausea with vomiting, unspecified: Secondary | ICD-10-CM | POA: Diagnosis not present

## 2019-11-19 DIAGNOSIS — R55 Syncope and collapse: Secondary | ICD-10-CM | POA: Diagnosis not present

## 2019-11-19 DIAGNOSIS — R1084 Generalized abdominal pain: Secondary | ICD-10-CM | POA: Diagnosis not present

## 2019-12-08 ENCOUNTER — Encounter: Payer: Self-pay | Admitting: Internal Medicine

## 2019-12-08 ENCOUNTER — Other Ambulatory Visit: Payer: Self-pay

## 2019-12-08 ENCOUNTER — Ambulatory Visit: Payer: Medicaid Other | Admitting: Internal Medicine

## 2019-12-08 VITALS — BP 113/70 | HR 76 | Temp 98.4°F | Wt 157.0 lb

## 2019-12-08 DIAGNOSIS — K219 Gastro-esophageal reflux disease without esophagitis: Secondary | ICD-10-CM

## 2019-12-08 DIAGNOSIS — R102 Pelvic and perineal pain: Secondary | ICD-10-CM

## 2019-12-08 MED ORDER — PANTOPRAZOLE SODIUM 40 MG PO TBEC: 40.0000 mg | DELAYED_RELEASE_TABLET | Freq: Every day | ORAL | 1 refills | Status: AC

## 2019-12-08 MED ORDER — AZITHROMYCIN 250 MG PO TABS: ORAL_TABLET | ORAL | 0 refills | Status: DC

## 2019-12-08 NOTE — Progress Notes (Signed)
New Patient Office Visit  Subjective:  Patient ID: [name], female    DOB: 08/05/1986  Age: 34 y.o. MRN: [id number]  CC:  Chief Complaint  Patient presents with  . Establish Care  . Abdominal Pain  . Gastroesophageal Reflux    HPI [name] presents to establish care and acute concern of lower abdominal pain and acid reflux. Please see problem based charting for further details.   Past Medical History:  Diagnosis Date  . Anemia   . GERD (gastroesophageal reflux disease)    with pregnancy only  - no med  . Preterm labor   . SVD (spontaneous vaginal delivery)    x 5    Past Surgical History:  Procedure Laterality Date  . DILITATION & CURRETTAGE/HYSTROSCOPY WITH HYDROTHERMAL ABLATION N/A 09/29/2019   Procedure: DILATATION & CURETTAGE/HYSTEROSCOPY WITH HYDROTHERMAL ABLATION;  Surgeon: Reva Bores, MD;  Location: [facility];  Service: Gynecology;  Laterality: N/A;  . LAPAROSCOPIC TUBAL LIGATION Bilateral 12/19/2015   Procedure: LAPAROSCOPIC TUBAL LIGATION with Anna Genre Clips;  Surgeon: Willodean Rosenthal, MD;  Location: [facility];  Service: Gynecology;  Laterality: Bilateral;  . WISDOM TOOTH EXTRACTION     ALL 4    Family History  Problem Relation Age of Onset  . Diabetes Maternal Aunt   . Diabetes Maternal Grandmother     Social History   Socioeconomic History  . Marital status: Single    Spouse name: Not on file  . Number of children: Not on file  . Years of education: Not on file  . Highest education level: Not on file  Occupational History  . Not on file  Tobacco Use  . Smoking status: Former Smoker    Packs/day: 0.25    Types: Cigarettes    Quit date: 03/07/2015    Years since quitting: 4.7  . Smokeless tobacco: Never Used  Substance and Sexual Activity  . Alcohol use: No  . Drug use: No  . Sexual activity: Yes    Birth control/protection: Condom  Other Topics Concern  . Not on file  Social History Narrative  . Not  on file   Social Determinants of Health   Financial Resource Strain:   . Difficulty of Paying Living Expenses:   Food Insecurity:   . Worried About Programme researcher, broadcasting/film/video in the Last Year:   . Barista in the Last Year:   Transportation Needs:   . Freight forwarder (Medical):   Marland Kitchen Lack of Transportation (Non-Medical):   Physical Activity:   . Days of Exercise per Week:   . Minutes of Exercise per Session:   Stress:   . Feeling of Stress :   Social Connections:   . Frequency of Communication with Friends and Family:   . Frequency of Social Gatherings with Friends and Family:   . Attends Religious Services:   . Active Member of Clubs or Organizations:   . Attends Banker Meetings:   Marland Kitchen Marital Status:   Intimate Partner Violence:   . Fear of Current or Ex-Partner:   . Emotionally Abused:   Marland Kitchen Physically Abused:   . Sexually Abused:     ROS Review of Systems  Constitutional: Negative for chills and fever.  Respiratory: Negative for cough and shortness of breath.   Cardiovascular: Negative for palpitations.  Gastrointestinal: Positive for abdominal pain and nausea. Negative for blood in stool, constipation and diarrhea.  Genitourinary: Negative for dysuria, menstrual problem, vaginal bleeding, vaginal discharge and  vaginal pain.  Psychiatric/Behavioral: Negative for sleep disturbance.    Objective:   Today's Vitals: BP 113/70 (BP Location: Left Arm, Patient Position: Sitting, Cuff Size: Normal)   Pulse 76   Temp 98.4 F (36.9 C) (Oral)   Wt 157 lb (71.2 kg)   SpO2 99%   BMI 31.71 kg/m   Physical Exam Constitutional:      General: She is not in acute distress.    Appearance: She is well-developed.  Eyes:     General: No scleral icterus. Cardiovascular:     Rate and Rhythm: Normal rate and regular rhythm.  Pulmonary:     Effort: Pulmonary effort is normal.     Breath sounds: Normal breath sounds.  Abdominal:     General: Bowel sounds are  normal. There is no distension.     Palpations: Abdomen is soft.     Tenderness: There is abdominal tenderness in the right lower quadrant, suprapubic area and left lower quadrant.  Skin:    General: Skin is warm and dry.  Neurological:     General: No focal deficit present.     Mental Status: She is alert and oriented to person, place, and time.  Psychiatric:        Mood and Affect: Mood normal.        Behavior: Behavior normal.     Assessment & Plan:   Problem List Items Addressed This Visit      Digestive   Gastroesophageal reflux disease - Primary    Patient endorses several month history of epigastric pain that radiates into her chest, as well as intermittent belching and nausea. Worse with spicy foods. She has not tried anything for her symptoms. No red flag signs such as weight loss or difficulty swallowing. Will do trial of PPI to see if symptoms improve.       Relevant Medications   pantoprazole (PROTONIX) 40 MG tablet     Other   Pelvic pain    Patient is followed by GYN. She had a D&C done in February for chronic pelvic pain. At a follow-up visit on 10/27/19, she was complaining of persistent pelvic pain. She was started on course of Doxy to treat possible endometritis. However, she developed nausea and vomiting soon after starting the antibiotic and was not able to complete the course.  She denies any fevers, chills, or further vomiting since stopping Doxycyline. Pelvic pain seems unchanged. Will try alternative antibiotic therapy with Azithro in order to appropriately treat possible endometritis to see if she has any improvement in her pain. Also encouraged her to follow-up closely with GYN at next available appointment.          Outpatient Encounter Medications as of 12/08/2019  Medication Sig  . acetaminophen (TYLENOL) 500 MG tablet Take 500 mg by mouth every 6 (six) hours as needed.  Marland Kitchen azithromycin (ZITHROMAX) 250 MG tablet Take 2 tabs on first day, followed by one  pill daily until completion.  . celecoxib (CELEBREX) 200 MG capsule TAKE 1 CAPSULE (200 MG TOTAL) BY MOUTH 2 (TWO) TIMES DAILY AS NEEDED.  Marland Kitchen cyclobenzaprine (FLEXERIL) 10 MG tablet TAKE 1 TABLET (10 MG TOTAL) BY MOUTH EVERY 8 (EIGHT) HOURS AS NEEDED FOR MUSCLE SPASMS.  Marland Kitchen doxycycline (VIBRAMYCIN) 100 MG capsule Take 1 capsule (100 mg total) by mouth 2 (two) times daily.  . pantoprazole (PROTONIX) 40 MG tablet Take 1 tablet (40 mg total) by mouth daily.  . promethazine (PHENERGAN) 12.5 MG tablet Take 1 tablet (12.5 mg  total) by mouth every 6 (six) hours as needed for nausea or vomiting.   No facility-administered encounter medications on file as of 12/08/2019.    Follow-up: Return in about 6 weeks (around 01/19/2020) for establish with PCP, GERD follow-up .   Delice Bison, DO

## 2019-12-08 NOTE — Patient Instructions (Signed)
Ms. Schoeller, It was nice meeting you! We are glad to have you establishing in clinic with [place].   Today we discussed:  1. Acid reflux - Please start taking Protonix once daily. It usually takes about 2 weeks for you to notice improvement in symptoms.   2. Pelvic pain - we will try a different antibiotic course for 5 days. Take 2 pills on day 1, followed by 1 pill daily until completion. Be sure to follow-up with Dr. Shawnie Pons as soon as possible.   Take care, Dr. Chesley Mires

## 2019-12-17 ENCOUNTER — Encounter: Payer: Self-pay | Admitting: Internal Medicine

## 2019-12-17 DIAGNOSIS — K219 Gastro-esophageal reflux disease without esophagitis: Secondary | ICD-10-CM | POA: Insufficient documentation

## 2019-12-17 NOTE — Assessment & Plan Note (Signed)
Patient endorses several month history of epigastric pain that radiates into her chest, as well as intermittent belching and nausea. Worse with spicy foods. She has not tried anything for her symptoms. No red flag signs such as weight loss or difficulty swallowing. Will do trial of PPI to see if symptoms improve.

## 2019-12-17 NOTE — Assessment & Plan Note (Addendum)
Patient is followed by GYN. She had a D&C done in February for chronic pelvic pain. At a follow-up visit on 10/27/19, she was complaining of persistent pelvic pain. She was started on course of Doxy to treat possible endometritis. However, she developed nausea and vomiting soon after starting the antibiotic and was not able to complete the course.  She denies any fevers, chills, or further vomiting since stopping Doxycyline. Pelvic pain seems unchanged. Will try alternative antibiotic therapy with Azithro in order to appropriately treat possible endometritis to see if she has any improvement in her pain. Also encouraged her to follow-up closely with GYN at next available appointment.

## 2019-12-20 DIAGNOSIS — Z029 Encounter for administrative examinations, unspecified: Secondary | ICD-10-CM

## 2019-12-20 NOTE — Progress Notes (Signed)
Internal Medicine Clinic Attending  Case discussed with Dr. Bloomfield at the time of the visit.  We reviewed the resident's history and exam and pertinent patient test results.  I agree with the assessment, diagnosis, and plan of care documented in the resident's note.  

## 2019-12-22 ENCOUNTER — Encounter: Payer: Self-pay | Admitting: Family Medicine

## 2019-12-22 ENCOUNTER — Ambulatory Visit: Payer: Medicaid Other | Admitting: Family Medicine

## 2019-12-22 NOTE — Progress Notes (Signed)
Patient did not keep appointment today. She may call to reschedule.  

## 2020-01-09 IMAGING — US US PELVIS COMPLETE WITH TRANSVAGINAL
1 series · 15 of 25 positions shown · non-contrast
Comparison: 09/13/2017 pelvic sonogram.

CLINICAL DATA: 33-year-old female with chronic pelvic pain and low
back pain related to menstrual cycle, for 2 years. Tubal ligation.
LMP 02/13/2019.

EXAM:
TRANSABDOMINAL AND TRANSVAGINAL ULTRASOUND OF PELVIS
TECHNIQUE: Both transabdominal and transvaginal ultrasound examinations of the
pelvis were performed. Transabdominal technique was performed for
global imaging of the pelvis including uterus, ovaries, adnexal
regions, and pelvic cul-de-sac. It was necessary to proceed with
endovaginal exam following the transabdominal exam to visualize the
endometrium, myometrium and adnexa.

[Series 1: us pelvis complete with transvaginal · 96 acquisitions, 15 frames shown]
[im 1/96]
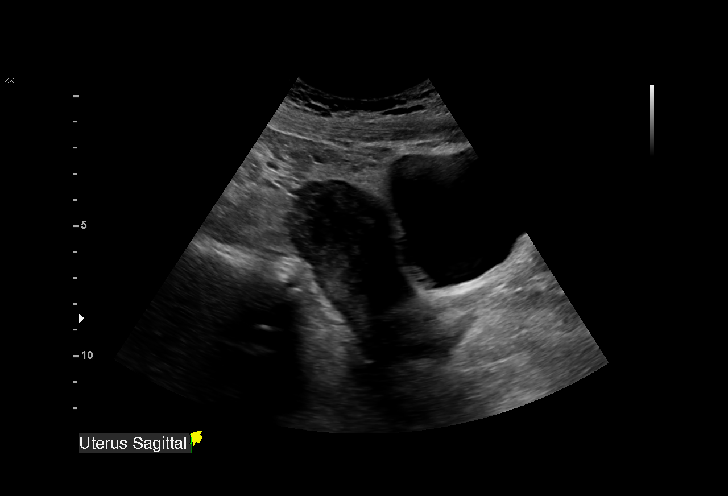
[im 8/96]
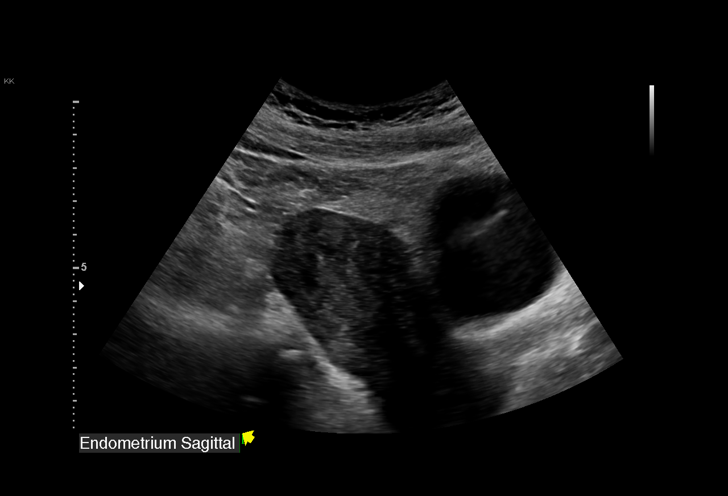
[im 16/96]
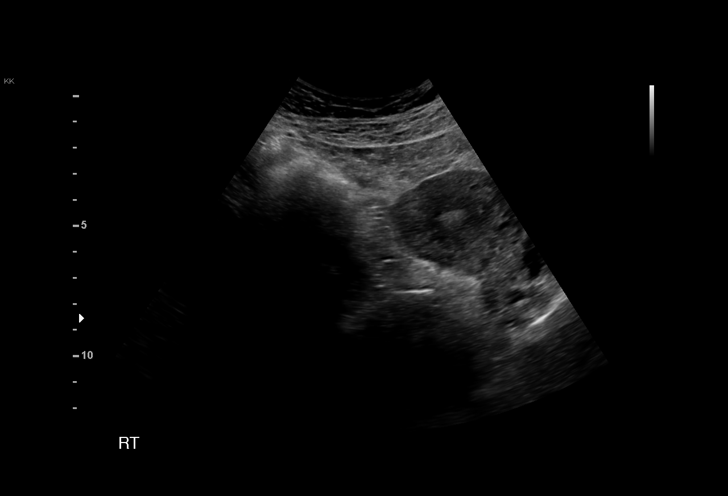
[im 20/96]
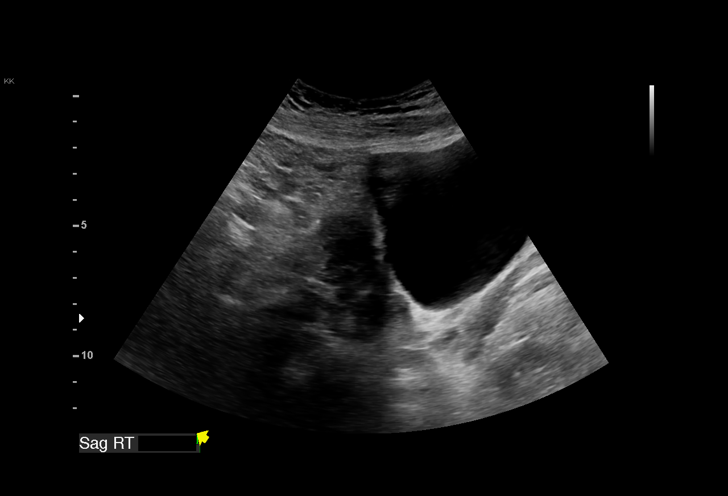
[im 28/96]
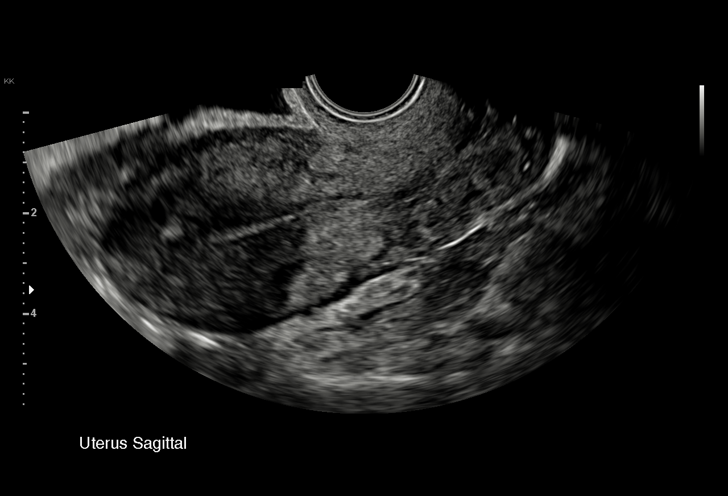
[im 36/96]
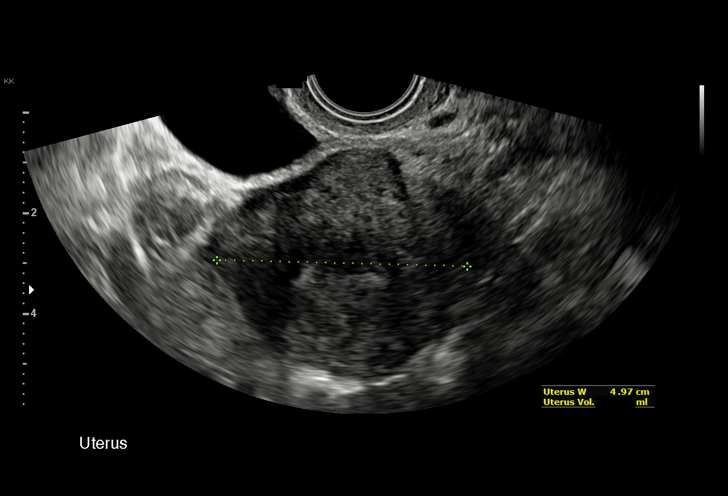
[im 40/96]
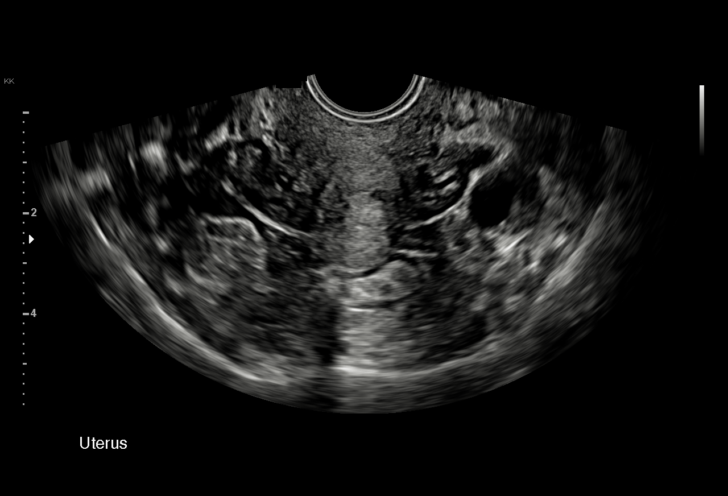
[im 48/96]
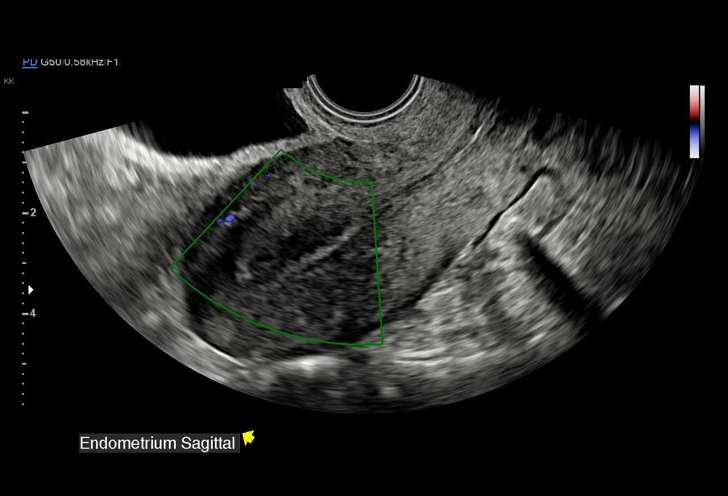
[im 56/96]
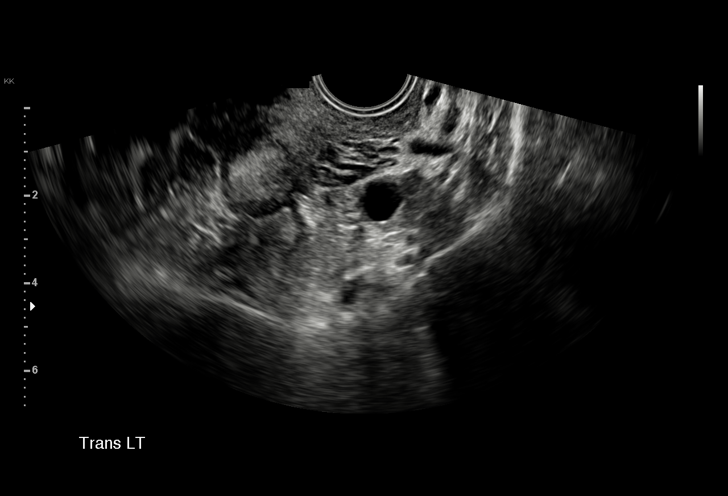
[im 60/96]
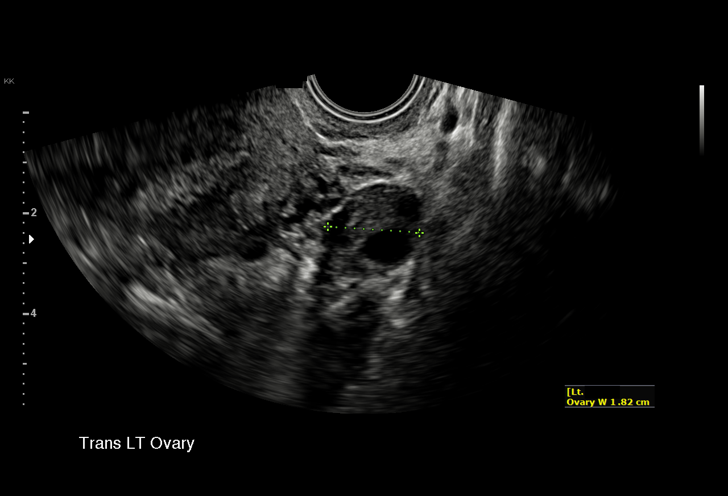
[im 68/96]
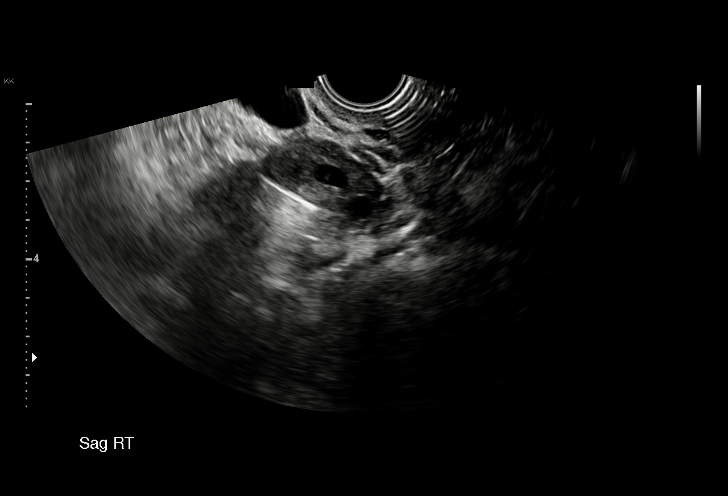
[im 76/96]
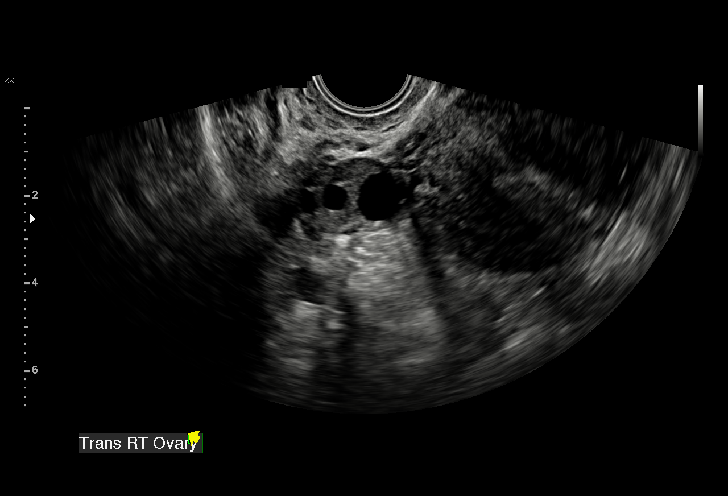
[im 80/96]
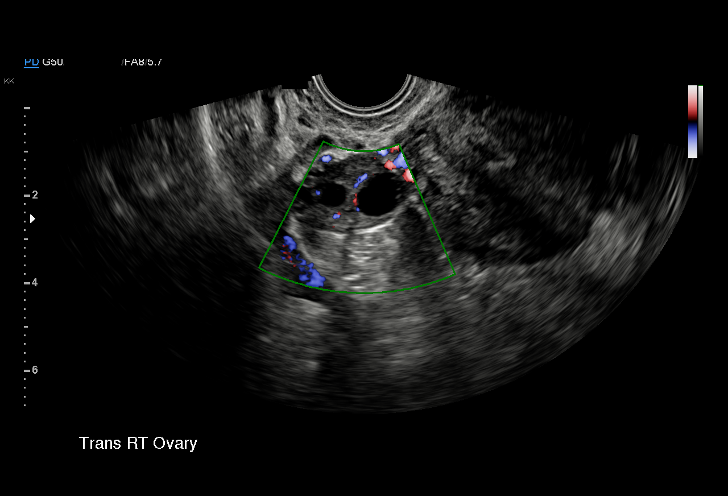
[im 88/96]
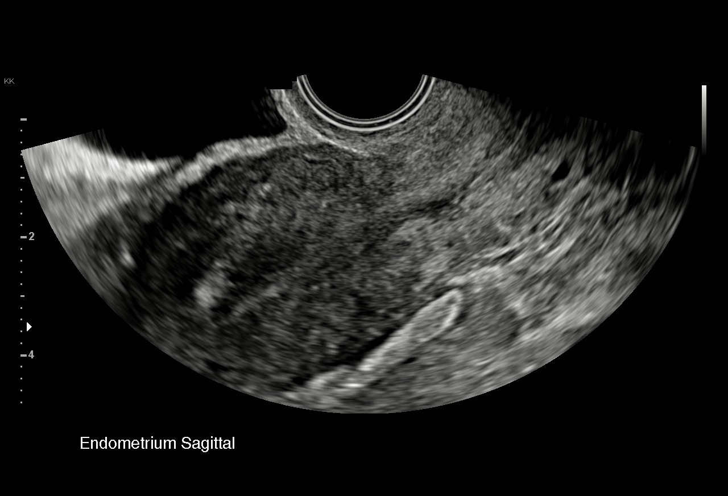
[im 96/96]
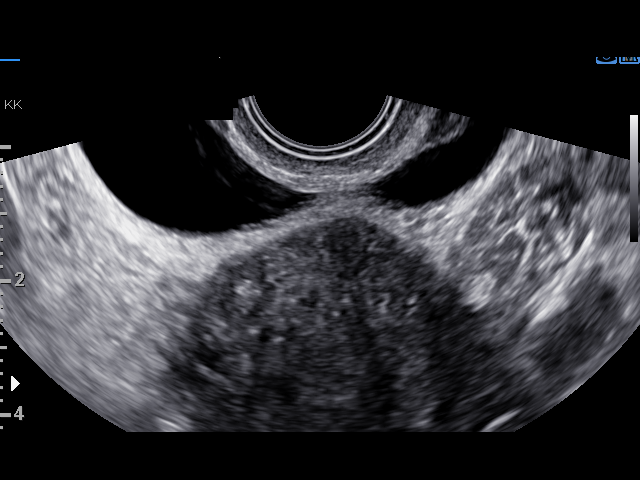

[15 of 25 positions shown; findings below may reference images not displayed]

FINDINGS: Uterus

Measurements: 9.3 x 4.2 x 5.0 cm = volume: 101 mL. Anteverted uterus
is normal in size and configuration, with no uterine fibroids or
other myometrial abnormalities.

Endometrium

Thickness: 8 mm. No endometrial cavity fluid or focal endometrial
mass.

Right ovary

Measurements: 2.8 x 1.7 x 2.4 cm = volume: 6.0 mL. Normal
appearance/no adnexal mass.

Left ovary

Measurements: 3.1 x 1.9 x 1.8 cm = volume: 5.5 mL. Normal
appearance/no adnexal mass.

Other findings

No abnormal free fluid.
IMPRESSION: Normal pelvic sonogram. No uterine fibroids. No ovarian or adnexal
abnormality.

## 2021-03-26 ENCOUNTER — Encounter: Payer: Self-pay | Admitting: Family Medicine

## 2021-03-26 ENCOUNTER — Other Ambulatory Visit: Payer: Self-pay

## 2021-03-26 ENCOUNTER — Ambulatory Visit (INDEPENDENT_AMBULATORY_CARE_PROVIDER_SITE_OTHER): Payer: Medicaid Other | Admitting: Family Medicine

## 2021-03-26 ENCOUNTER — Other Ambulatory Visit (HOSPITAL_COMMUNITY)
Admission: RE | Admit: 2021-03-26 | Discharge: 2021-03-26 | Disposition: A | Discharge status: Home / Self Care | Payer: Medicaid Other | Source: Ambulatory Visit | Attending: Family Medicine | Admitting: Family Medicine

## 2021-03-26 VITALS — BP 118/75 | HR 84 | Ht 59.0 in | Wt 136.8 lb

## 2021-03-26 DIAGNOSIS — R102 Pelvic and perineal pain: Secondary | ICD-10-CM | POA: Insufficient documentation

## 2021-03-26 DIAGNOSIS — R300 Dysuria: Secondary | ICD-10-CM

## 2021-03-26 LAB — POCT URINALYSIS DIP (DEVICE)
Bilirubin Urine: NEGATIVE
Glucose, UA: NEGATIVE mg/dL
Hgb urine dipstick: NEGATIVE
Ketones, ur: NEGATIVE mg/dL
Leukocytes,Ua: NEGATIVE
Nitrite: NEGATIVE
Protein, ur: NEGATIVE mg/dL
Specific Gravity, Urine: 1.025 (ref 1.005–1.030)
Urobilinogen, UA: 1 mg/dL (ref 0.0–1.0)
pH: 7 (ref 5.0–8.0)

## 2021-03-26 MED ORDER — PHENAZOPYRIDINE HCL 100 MG PO TABS: 100.0000 mg | ORAL_TABLET | Freq: Three times a day (TID) | ORAL | 0 refills | Status: DC | PRN

## 2021-03-26 MED ORDER — DICLOFENAC SODIUM 75 MG PO TBEC: 75.0000 mg | DELAYED_RELEASE_TABLET | Freq: Two times a day (BID) | ORAL | 2 refills | Status: DC

## 2021-03-26 NOTE — Progress Notes (Signed)
i  Subjective:    Patient ID: [name] is a 35 y.o. female presenting with Urinary Frequency  on 03/26/2021  HPI: Has been having urinary frequency x 2-3 weeks. Nocturia, getting up q 4-5 x/night. Having some pelvic pain. Had some spotting a few weeks ago.  Review of Systems  Constitutional:  Positive for chills. Negative for fever.  Respiratory:  Negative for shortness of breath.   Cardiovascular:  Negative for chest pain.  Gastrointestinal:  Negative for abdominal pain, nausea and vomiting.  Genitourinary:  Positive for dysuria, frequency, hematuria and pelvic pain.  Skin:  Negative for rash.     Objective:    BP 118/75   Pulse 84   Ht 4\' 11"  (1.499 m)   Wt 136 lb 12.8 oz (62.1 kg)   LMP 03/05/2021 (Approximate) Comment: spotting  BMI 27.63 kg/m  Physical Exam Constitutional:      General: She is not in acute distress.    Appearance: She is well-developed.  HENT:     Head: Normocephalic and atraumatic.  Eyes:     General: No scleral icterus. Cardiovascular:     Rate and Rhythm: Normal rate.  Pulmonary:     Effort: Pulmonary effort is normal.  Abdominal:     Palpations: Abdomen is soft.  Musculoskeletal:     Cervical back: Neck supple.  Skin:    General: Skin is warm and dry.  Neurological:     Mental Status: She is alert and oriented to person, place, and time.        Assessment & Plan:   Problem List Items Addressed This Visit       Unprioritized   Pelvic pain    Check pelvic sonogram      Relevant Medications   diclofenac (VOLTAREN) 75 MG EC tablet   Other Relevant Orders   03/07/2021 PELVIC COMPLETE WITH TRANSVAGINAL   GC/Chlamydia probe amp ([facility])not at [facility]   Other Visit Diagnoses     Dysuria    -  Primary   trial of Pyridium, check urine culture, if no improvement and negative urine cx, consider r/o DM, urology referral.   Relevant Medications   phenazopyridine (PYRIDIUM) 100 MG tablet   Other Relevant Orders   Urine Culture        Return in about 4 weeks (around 04/23/2021) for a follow-up, virtual.  06/23/2021 03/26/2021 4:22 PM

## 2021-03-26 NOTE — Progress Notes (Signed)
Patient did not keep appointment today. She may call to reschedule.  

## 2021-03-26 NOTE — Assessment & Plan Note (Signed)
Check pelvic sonogram

## 2021-03-26 NOTE — Progress Notes (Signed)
Patient with complaints of urinary frequency. States this has been going on for several weeks, this is creating issues with her sleeping as well. During the night patient states between the hours of 1130-0600 she is using the bathroom around 4-5x's. Feels like she is emptying her bladder but the urge comes back quickly. During the day she said this issue is still consistent. Has been drinking cranberry juice to see if this helped with the problem but does not feel like it has. Has been having tenderness around her pelvic and sides.   Wynona Canes, CMA

## 2021-03-27 LAB — URINE CULTURE

## 2021-03-27 LAB — GC/CHLAMYDIA PROBE AMP (~~LOC~~) NOT AT ARMC
Chlamydia: NEGATIVE
Comment: NEGATIVE
Comment: NORMAL
Neisseria Gonorrhea: NEGATIVE

## 2021-03-28 ENCOUNTER — Other Ambulatory Visit: Payer: Self-pay

## 2021-03-28 ENCOUNTER — Ambulatory Visit
Admission: RE | Admit: 2021-03-28 | Discharge: 2021-03-28 | Disposition: A | Discharge status: Home / Self Care | Payer: Medicaid Other | Source: Ambulatory Visit | Attending: Family Medicine | Admitting: Family Medicine

## 2021-03-28 DIAGNOSIS — R102 Pelvic and perineal pain: Secondary | ICD-10-CM | POA: Insufficient documentation

## 2021-04-30 ENCOUNTER — Encounter: Payer: Self-pay | Admitting: Obstetrics and Gynecology

## 2021-04-30 ENCOUNTER — Telehealth: Payer: Medicaid Other | Admitting: Obstetrics and Gynecology

## 2021-04-30 DIAGNOSIS — R102 Pelvic and perineal pain: Secondary | ICD-10-CM

## 2021-04-30 NOTE — Progress Notes (Signed)
Unable to reach pt. Will reschedule

## 2022-01-13 IMAGING — US US PELVIS COMPLETE WITH TRANSVAGINAL
1 series · 15 of 25 positions shown · non-contrast
Comparison: 03/24/2019

CLINICAL DATA: Pelvic pain



[Series 1: us pelvis complete with transvaginal · 89 acquisitions, 15 frames shown]
[im 1/89]
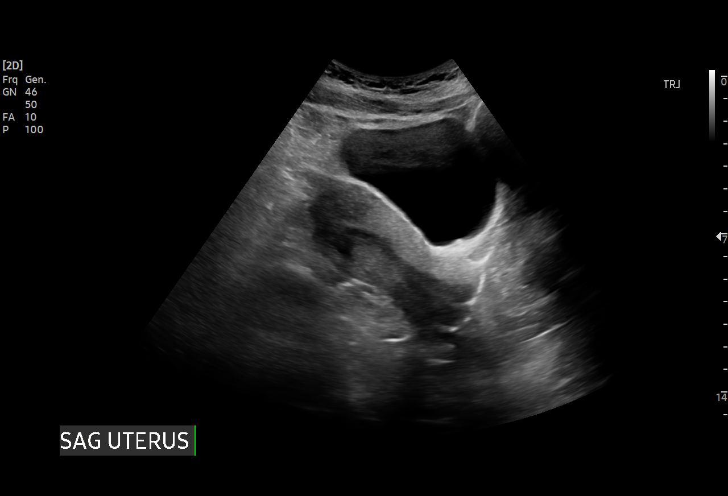
[im 8/89]
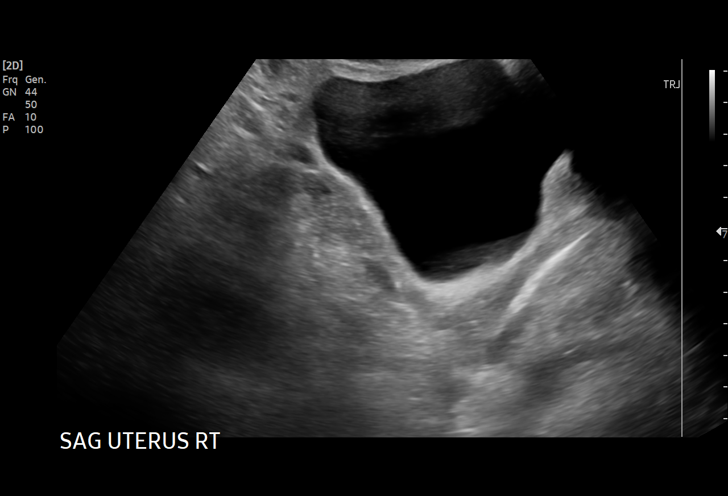
[im 15/89]
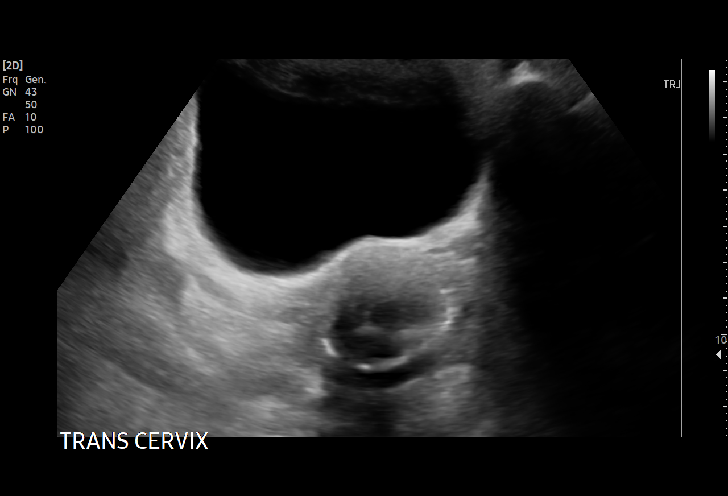
[im 19/89]
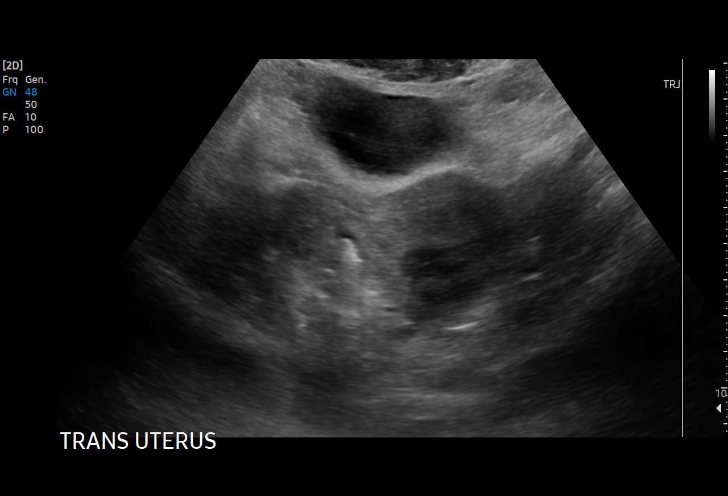
[im 26/89]
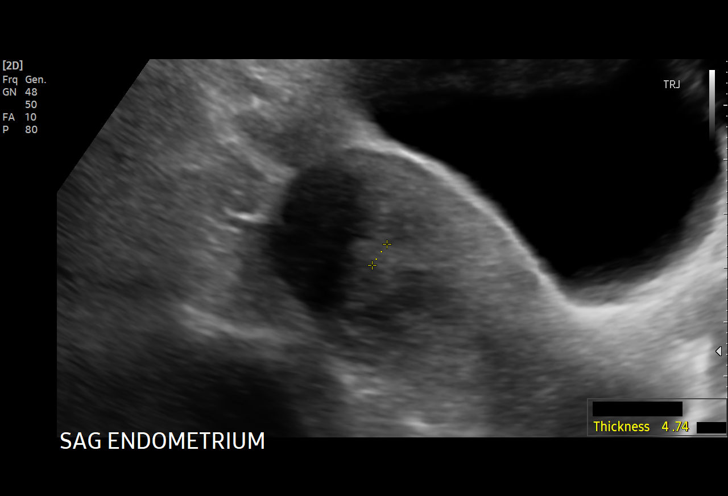
[im 34/89]
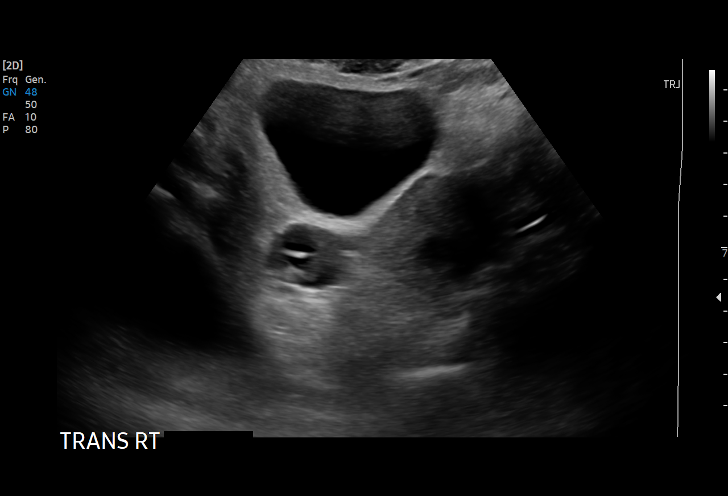
[im 37/89]
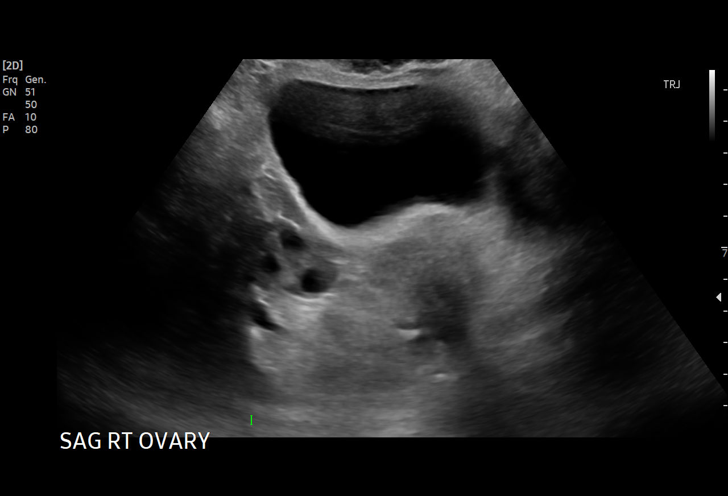
[im 45/89]
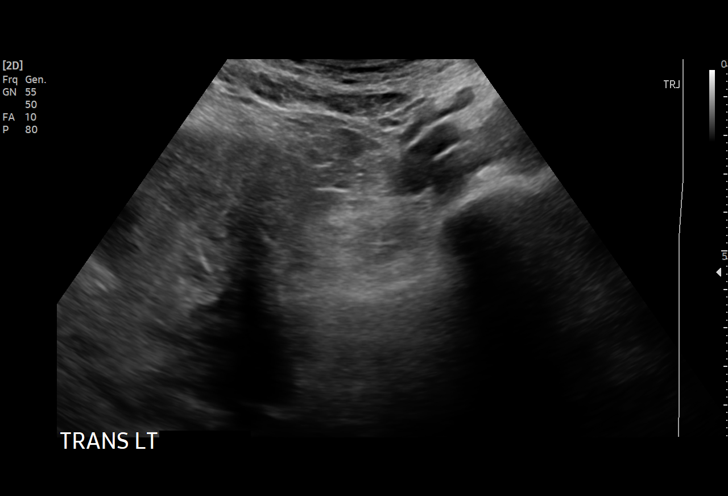
[im 52/89]
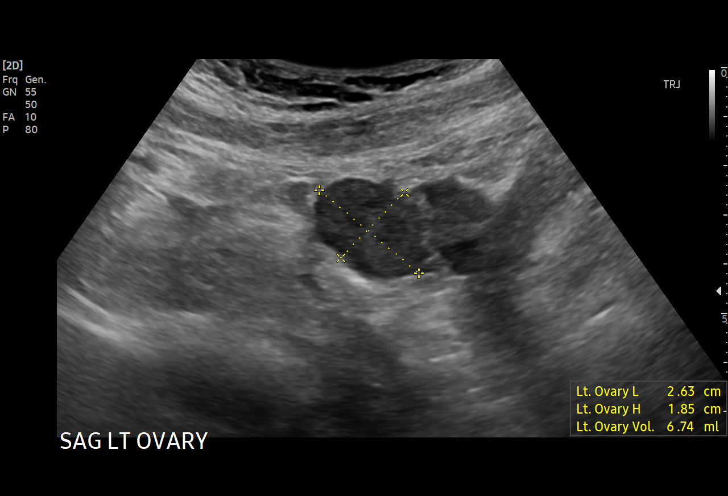
[im 56/89]
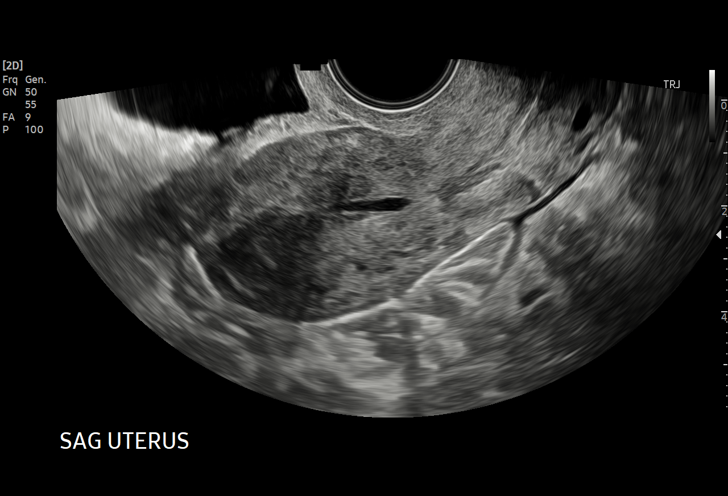
[im 63/89]
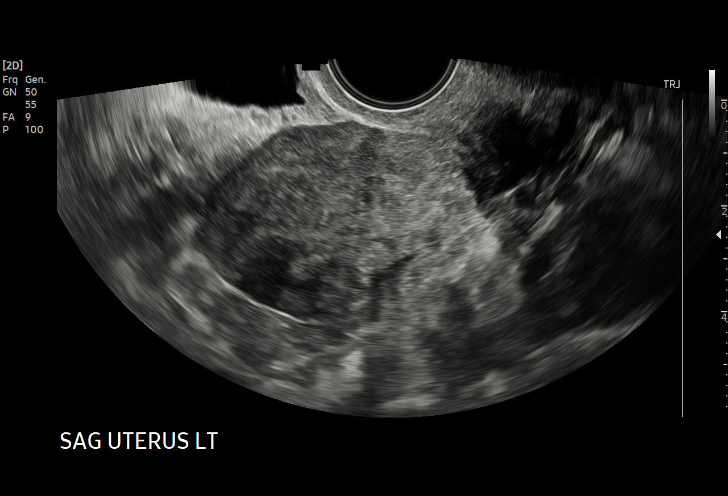
[im 70/89]
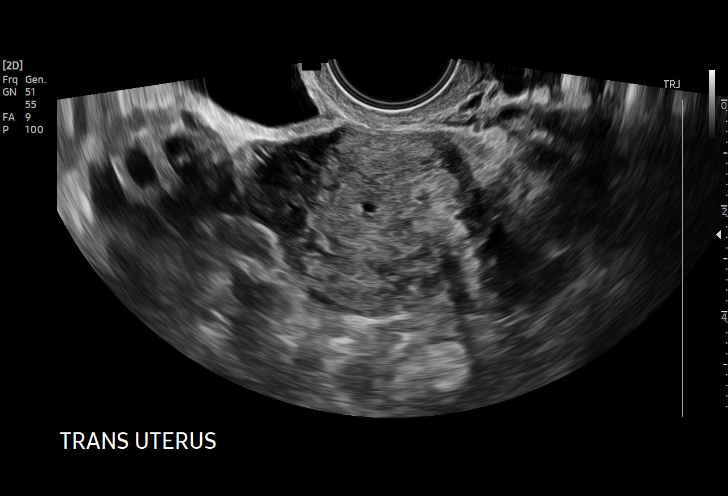
[im 74/89]
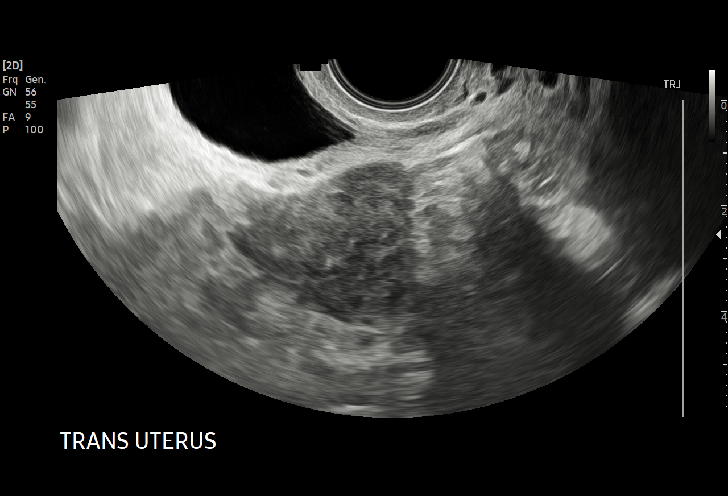
[im 81/89]
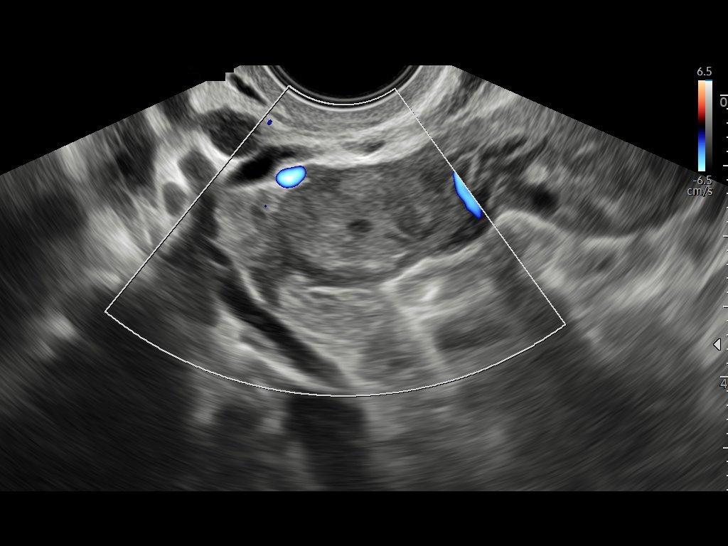
[im 89/89]
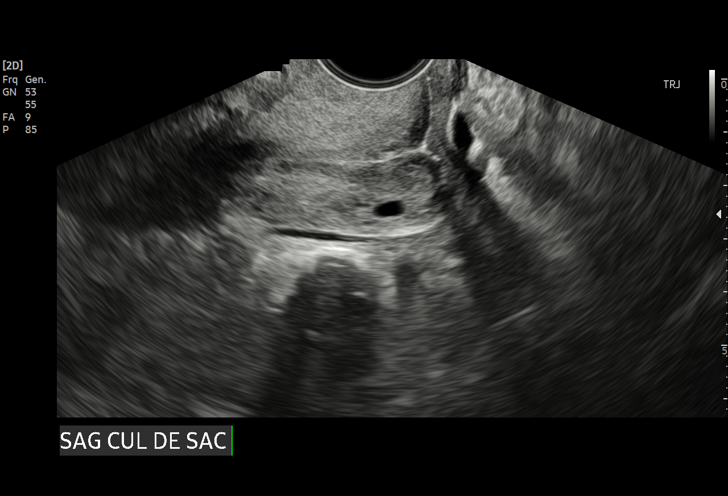

[15 of 25 positions shown; findings below may reference images not displayed]

FINDINGS: Uterus

Measurements: 8.0 x 3.6 x 4.1 cm = volume: 62 mL. Heterogeneous
echotexture. No focal measurable fibroid.

Endometrium

Thickness: 2 mm in thickness.  No focal abnormality visualized.

Right ovary

Measurements: 3.4 x 1.8 x 3.3 cm = volume: 10.3 mL. Normal
appearance/no adnexal mass.

Left ovary

Measurements: 3.5 x 1.7 x 2.5 cm = volume: 7.8 mL. Normal
appearance/no adnexal mass.

Other findings

No abnormal free fluid.
IMPRESSION: Heterogeneous echotexture throughout the uterus which can be seen
with adenomyosis.

No acute findings.

## 2022-03-18 ENCOUNTER — Encounter (HOSPITAL_COMMUNITY): Payer: Self-pay | Admitting: Emergency Medicine

## 2022-03-18 ENCOUNTER — Other Ambulatory Visit: Payer: Self-pay

## 2022-03-18 ENCOUNTER — Emergency Department (HOSPITAL_COMMUNITY)
Admission: EM | Admit: 2022-03-18 | Discharge: 2022-03-18 | Disposition: A | Discharge status: Home / Self Care | Payer: Self-pay | Attending: Emergency Medicine | Admitting: Emergency Medicine

## 2022-03-18 ENCOUNTER — Emergency Department (HOSPITAL_COMMUNITY): Payer: Self-pay

## 2022-03-18 DIAGNOSIS — D649 Anemia, unspecified: Secondary | ICD-10-CM | POA: Insufficient documentation

## 2022-03-18 DIAGNOSIS — R102 Pelvic and perineal pain: Secondary | ICD-10-CM | POA: Insufficient documentation

## 2022-03-18 DIAGNOSIS — R197 Diarrhea, unspecified: Secondary | ICD-10-CM | POA: Insufficient documentation

## 2022-03-18 DIAGNOSIS — R112 Nausea with vomiting, unspecified: Secondary | ICD-10-CM | POA: Insufficient documentation

## 2022-03-18 DIAGNOSIS — R350 Frequency of micturition: Secondary | ICD-10-CM | POA: Insufficient documentation

## 2022-03-18 LAB — COMPREHENSIVE METABOLIC PANEL
ALT: 16 U/L (ref 0–44)
AST: 15 U/L (ref 15–41)
Albumin: 3.9 g/dL (ref 3.5–5.0)
Alkaline Phosphatase: 32 U/L — ABNORMAL LOW (ref 38–126)
Anion gap: 8 (ref 5–15)
BUN: 7 mg/dL (ref 6–20)
CO2: 24 mmol/L (ref 22–32)
Calcium: 9.4 mg/dL (ref 8.9–10.3)
Chloride: 106 mmol/L (ref 98–111)
Creatinine, Ser: 0.68 mg/dL (ref 0.44–1.00)
GFR, Estimated: >60 mL/min (ref >60)
Glucose, Bld: 95 mg/dL (ref 70–99)
Potassium: 3.8 mmol/L (ref 3.5–5.1)
Sodium: 138 mmol/L (ref 135–145)
Total Bilirubin: 0.6 mg/dL (ref 0.3–1.2)
Total Protein: 6.7 g/dL (ref 6.5–8.1)

## 2022-03-18 LAB — I-STAT BETA HCG BLOOD, ED (MC, WL, AP ONLY): I-stat hCG, quantitative: <5 m[IU]/mL (ref <5)

## 2022-03-18 LAB — URINALYSIS, ROUTINE W REFLEX MICROSCOPIC
Bilirubin Urine: NEGATIVE
Glucose, UA: NEGATIVE mg/dL
Ketones, ur: NEGATIVE mg/dL
Leukocytes,Ua: NEGATIVE
Nitrite: NEGATIVE
Protein, ur: NEGATIVE mg/dL
Specific Gravity, Urine: 1.017 (ref 1.005–1.030)
pH: 5 (ref 5.0–8.0)

## 2022-03-18 LAB — CBC
HCT: 35.8 % — ABNORMAL LOW (ref 36.0–46.0)
Hemoglobin: 11.5 g/dL — ABNORMAL LOW (ref 12.0–15.0)
MCH: 32.2 pg (ref 26.0–34.0)
MCHC: 32.1 g/dL (ref 30.0–36.0)
MCV: 100.3 fL — ABNORMAL HIGH (ref 80.0–100.0)
Platelets: 356 10*3/uL (ref 150–400)
RBC: 3.57 MIL/uL — ABNORMAL LOW (ref 3.87–5.11)
RDW: 11.9 % (ref 11.5–15.5)
WBC: 7.3 10*3/uL (ref 4.0–10.5)
nRBC: 0 % (ref 0.0–0.2)

## 2022-03-18 LAB — WET PREP, GENITAL
Clue Cells Wet Prep HPF POC: NONE SEEN
Sperm: NONE SEEN
Trich, Wet Prep: NONE SEEN
WBC, Wet Prep HPF POC: <10 (ref <10)
Yeast Wet Prep HPF POC: NONE SEEN

## 2022-03-18 LAB — LIPASE, BLOOD: Lipase: 23 U/L (ref 11–51)

## 2022-03-18 MED ORDER — DOXYCYCLINE HYCLATE 100 MG PO CAPS: 100.0000 mg | ORAL_CAPSULE | Freq: Two times a day (BID) | ORAL | 0 refills | Status: AC

## 2022-03-18 MED ORDER — HYDROCODONE-ACETAMINOPHEN 5-325 MG PO TABS
1.0000 | ORAL_TABLET | Freq: Once | ORAL | Status: AC
  Administered 2022-03-18: 1 via ORAL
  Filled 2022-03-18: qty 1

## 2022-03-18 MED ORDER — IOHEXOL 300 MG/ML  SOLN
100.0000 mL | Freq: Once | INTRAMUSCULAR | Status: AC | PRN
  Administered 2022-03-18: 100 mL via INTRAVENOUS

## 2022-03-18 MED ORDER — LIDOCAINE HCL 2 % IJ SOLN
INTRAMUSCULAR | Status: AC
  Administered 2022-03-18: 5 mg
  Filled 2022-03-18: qty 20

## 2022-03-18 MED ORDER — CEFTRIAXONE SODIUM 500 MG IJ SOLR
500.0000 mg | Freq: Once | INTRAMUSCULAR | Status: AC
  Administered 2022-03-18: 500 mg via INTRAMUSCULAR
  Filled 2022-03-18: qty 500

## 2022-03-18 NOTE — Discharge Instructions (Addendum)
You were seen today for abdominal and pelvic pain.  You have been prescribed an antibiotic called doxycycline.  You should take this medication 1 pill by mouth 2 times daily for 10 days. It is important that you follow-up with your primary care doctor and OB/GYN in the next 2 to 3 days regarding today's visit and your symptoms. Until you are able to follow-up with your OB/GYN, it is important that you use a backup method of contraception during sexual intercourse to prevent pregnancy. Please return to the emergency department if you experience any chest pain, difficulty breathing, abdominal pain, altered mental status, severe fever, or any other concerning symptom.  Thank you for allowing [place] to participate in your care.  Below are the results of your CT abdomen/pelvis.  Please take these results to your primary care doctor and OB/GYN for further follow-up.  1. Mild asymmetric enlargement of the right ovary, with evidence of ruptured follicle. If right adnexal pathology is suspected as the cause of patient pain, pelvic ultrasound could be considered. 2. Mild fat stranding surrounding the distal colon at the endo rectal junction, which could reflect mild acute uncomplicated diverticulitis or focal colitis. 3. Trace pelvic free fluid. 4. Interval migration of the bilateral tubal ligation clips, which are now located in the left lower pelvis separate from the fallopian tubes. 5. Normal appendix.

## 2022-03-18 NOTE — ED Triage Notes (Signed)
Patient here with complaint of abdominal pain, polyuria, some intermittent diarrhea, that started yesterday morning and vaginal bleeding that started last night. Patient is alert, oriented, ambulatory, and in no apparent distress at this time.

## 2022-03-18 NOTE — ED Provider Triage Note (Signed)
Emergency Medicine Provider Triage Evaluation Note  [name] , a 36 y.o. female  was evaluated in triage.  Pt complains of abdominal pain.  Patient states that for the past 2 days she has had right lower quadrant abdominal pain and pelvic pain.  She states that she has been having urinary frequency which is abnormal for her and then yesterday began having spotting and bleeding.  She states that she is not supposed to have a period because she has had an ablation previously.  She has also had nausea and an episode of vomiting.  Had diarrhea yesterday.  She denies any fevers but is having hot flashes..  Review of Systems  Positive:  Negative:   Physical Exam  BP 131/74   Pulse 78   Temp 98.3 F (36.8 C) (Oral)   Resp 16   SpO2 100%  Gen:   Awake, no distress   Resp:  Normal effort  MSK:   Moves extremities without difficulty  Other:  Abdomen is round and soft  Medical Decision Making  Medically screening exam initiated at 11:35 AM.  Appropriate orders placed.  Lavell Ridings was informed that the remainder of the evaluation will be completed by another provider, this initial triage assessment does not replace that evaluation, and the importance of remaining in the ED until their evaluation is complete.     Cristopher Peru, PA-C 03/18/22 1136

## 2022-03-18 NOTE — ED Provider Notes (Signed)
[facility] EMERGENCY DEPARTMENT Provider Note   CSN: [id number] Arrival date & time: 03/18/22  1106   History  Chief Complaint  Patient presents with   Abdominal Pain    Dresden Ament is a 36 y.o. female PMH pelvic pain, bilateral tubal ligation, chronic left-sided low back pain, GERD is presenting with abdominal pain.  Patient reports her symptoms began 2 days ago, and will occasionally go away "when I sit really still".  However, symptoms of recur/the patient attempts to leave.  Abdominal pain is located in the suprapubic region.  She denies any radiation.  Patient also has noticed that she has had to urinate more frequently than normal since her symptoms began, sometimes 5 times an hour.  She reports some vaginal spotting yesterday, which is unusual for her, as she has had an ablation done, and does not have her period regularly.  She is currently sexually active, but denies any new partners recently.  She is not concerned for STIs.  She denies any vaginal discharge.  Patient also notes 1 episode of "looser stool than normal", though she denies diarrhea.  This has not recurred today.  She has felt nauseous since her symptoms began, and reports vomiting once.  Patient denies any fevers, chest pain, difficulty breathing.   Home Medications Prior to Admission medications   Medication Sig Start Date End Date Taking? Authorizing Provider  acetaminophen (TYLENOL) 500 MG tablet Take 500 mg by mouth every 6 (six) hours as needed.    [provider]  azithromycin (ZITHROMAX) 250 MG tablet Take 2 tabs on first day, followed by one pill daily until completion. Patient not taking: Reported on 03/26/2021 12/08/19   Lenward Chancellor D, DO  celecoxib (CELEBREX) 200 MG capsule TAKE 1 CAPSULE (200 MG TOTAL) BY MOUTH 2 (TWO) TIMES DAILY AS NEEDED. 05/13/19   Reva Bores, MD  cyclobenzaprine (FLEXERIL) 10 MG tablet TAKE 1 TABLET (10 MG TOTAL) BY MOUTH EVERY 8 (EIGHT) HOURS  AS NEEDED FOR MUSCLE SPASMS. 10/28/19   Reva Bores, MD  diclofenac (VOLTAREN) 75 MG EC tablet Take 1 tablet (75 mg total) by mouth 2 (two) times daily with a meal. 03/26/21   Reva Bores, MD  doxycycline (VIBRAMYCIN) 100 MG capsule Take 1 capsule (100 mg total) by mouth 2 (two) times daily. Patient not taking: Reported on 03/26/2021 10/27/19   Reva Bores, MD  pantoprazole (PROTONIX) 40 MG tablet Take 1 tablet (40 mg total) by mouth daily. 12/08/19 02/06/20  [place], Karma Ganja D, DO  phenazopyridine (PYRIDIUM) 100 MG tablet Take 1 tablet (100 mg total) by mouth 3 (three) times daily as needed for pain. 03/26/21   Reva Bores, MD  promethazine (PHENERGAN) 12.5 MG tablet Take 1 tablet (12.5 mg total) by mouth every 6 (six) hours as needed for nausea or vomiting. Patient not taking: Reported on 03/26/2021 10/11/19   Reva Bores, MD      Allergies    Patient has no known allergies.    Review of Systems   Review of Systems  Gastrointestinal:  Positive for abdominal pain.    Physical Exam Updated Vital Signs BP 114/66 (BP Location: Right Arm)   Pulse 72   Temp 98.6 F (37 C) (Oral)   Resp 15   SpO2 100%  Physical Exam Vitals and nursing note reviewed.  Constitutional:      General: She is not in acute distress.    Appearance: Normal appearance. She is well-developed. She is not  ill-appearing or diaphoretic.  HENT:     Head: Normocephalic and atraumatic.     Right Ear: External ear normal.     Left Ear: External ear normal.     Nose: Nose normal.     Mouth/Throat:     Mouth: Mucous membranes are moist.  Cardiovascular:     Rate and Rhythm: Normal rate and regular rhythm.     Heart sounds: No murmur heard. Pulmonary:     Effort: Pulmonary effort is normal. No respiratory distress.     Breath sounds: Normal breath sounds.  Abdominal:     General: There is no distension.     Palpations: Abdomen is soft.     Tenderness: There is abdominal tenderness in the suprapubic area.  There is no guarding or rebound. Negative signs include McBurney's sign.  Genitourinary:    Vagina: Normal. No vaginal discharge, erythema or bleeding.     Cervix: Cervical motion tenderness and discharge present. No friability.     Adnexa: Right adnexa normal.       Left: Tenderness present.      Rectum: Normal.  Musculoskeletal:     Cervical back: Neck supple.     Right lower leg: No edema.     Left lower leg: No edema.  Skin:    General: Skin is warm and dry.  Neurological:     Mental Status: She is alert.     ED Results / Procedures / Treatments   Labs (all labs ordered are listed, but only abnormal results are displayed) Labs Reviewed  COMPREHENSIVE METABOLIC PANEL - Abnormal; Notable for the following components:      Result Value   Alkaline Phosphatase 32 (*)    All other components within normal limits  CBC - Abnormal; Notable for the following components:   RBC 3.57 (*)    Hemoglobin 11.5 (*)    HCT 35.8 (*)    MCV 100.3 (*)    All other components within normal limits  URINALYSIS, ROUTINE W REFLEX MICROSCOPIC - Abnormal; Notable for the following components:   APPearance HAZY (*)    Hgb urine dipstick MODERATE (*)    Bacteria, UA RARE (*)    All other components within normal limits  LIPASE, BLOOD  I-STAT BETA HCG BLOOD, ED ([facility], [facility], [facility] ONLY)    EKG None  Radiology CT ABDOMEN PELVIS W CONTRAST  Result Date: 03/18/2022 CLINICAL DATA:  Right lower quadrant abdominal and pelvic pain for 2 days, vaginal bleeding EXAM: CT ABDOMEN AND PELVIS WITH CONTRAST TECHNIQUE: Multidetector CT imaging of the abdomen and pelvis was performed using the standard protocol following bolus administration of intravenous contrast. RADIATION DOSE REDUCTION: This exam was performed according to the departmental dose-optimization program which includes automated exposure control, adjustment of the mA and/or kV according to patient size and/or use of iterative reconstruction technique.  CONTRAST:  OMNIPAQUE IOHEXOL 300 MG/ML  SOLN COMPARISON:  09/12/2017 FINDINGS: Lower chest: No acute pleural or parenchymal lung disease. Hepatobiliary: No focal liver abnormality is seen. No gallstones, gallbladder wall thickening, or biliary dilatation. Pancreas: Unremarkable. No pancreatic ductal dilatation or surrounding inflammatory changes. Spleen: Normal in size without focal abnormality. Adrenals/Urinary Tract: The kidneys enhance normally and symmetrically. The adrenals are unremarkable. Bladder is decompressed, limiting its evaluation. Stomach/Bowel: There is mild distal colonic diverticulosis, with mild fat stranding surrounding the and rectal junction in the lower pelvis. This could reflect focal colitis or acute uncomplicated diverticulitis. No bowel obstruction or ileus. Normal appendix right lower  quadrant. Vascular/Lymphatic: No significant vascular findings are present. No enlarged abdominal or pelvic lymph nodes. Reproductive: Uterus and left adnexa are unremarkable. The right ovary appears mildly enlarged measuring 4.1 x 3.2 by 3.3 cm, with likely ruptured follicle identified. If further evaluation is desired, pelvic ultrasound could be considered. The tubal ligation clips seen previously have changed position, and are now located within the left lower pelvis separate from the fallopian tubes. Other: Trace free fluid within the lower pelvis. No free intraperitoneal gas. No abdominal wall hernia. Musculoskeletal: No acute or destructive bony lesions. Reconstructed images demonstrate no additional findings. IMPRESSION: 1. Mild asymmetric enlargement of the right ovary, with evidence of ruptured follicle. If right adnexal pathology is suspected as the cause of patient pain, pelvic ultrasound could be considered. 2. Mild fat stranding surrounding the distal colon at the endo rectal junction, which could reflect mild acute uncomplicated diverticulitis or focal colitis. 3. Trace pelvic free fluid.  4. Interval migration of the bilateral tubal ligation clips, which are now located in the left lower pelvis separate from the fallopian tubes. 5. Normal appendix. Electronically Signed   By: Sharlet Salina M.D.   On: 03/18/2022 19:31   US Pelvis Complete  Result Date: 03/18/2022 CLINICAL DATA:  Patient with pelvic pain status post tubal ligation and ablation. EXAM: TRANSABDOMINAL AND TRANSVAGINAL ULTRASOUND OF PELVIS DOPPLER ULTRASOUND OF OVARIES TECHNIQUE: Both transabdominal and transvaginal ultrasound examinations of the pelvis were performed. Transabdominal technique was performed for global imaging of the pelvis including uterus, ovaries, adnexal regions, and pelvic cul-de-sac. It was necessary to proceed with endovaginal exam following the transabdominal exam to visualize the adnexal structures. Color and duplex Doppler ultrasound was utilized to evaluate blood flow to the ovaries. COMPARISON:  Pelvic ultrasound March 28, 2021 FINDINGS: Uterus Measurements: 8.0 x 3.6 x 4.1 cm = volume: 61.9 mL. No fibroids or other mass visualized. Endometrium Thickness: 2 mm.  No focal abnormality visualized. Right ovary Measurements: 3.4 x 1.8 x 3.3 cm = volume: 10.3 mL. Normal appearance/no adnexal mass. Left ovary Measurements: 3.5 x 1.7 x 2.5 cm = volume: 7.8 mL. Normal appearance/no adnexal mass. Pulsed Doppler evaluation of both ovaries demonstrates normal low-resistance arterial and venous waveforms. Other findings Trace fluid in the pelvis. IMPRESSION: No acute process. Electronically Signed   By: Annia Belt M.D.   On: 03/18/2022 17:44   US Transvaginal Non-OB  Result Date: 03/18/2022 CLINICAL DATA:  Patient with pelvic pain status post tubal ligation and ablation. EXAM: TRANSABDOMINAL AND TRANSVAGINAL ULTRASOUND OF PELVIS DOPPLER ULTRASOUND OF OVARIES TECHNIQUE: Both transabdominal and transvaginal ultrasound examinations of the pelvis were performed. Transabdominal technique was performed for global imaging  of the pelvis including uterus, ovaries, adnexal regions, and pelvic cul-de-sac. It was necessary to proceed with endovaginal exam following the transabdominal exam to visualize the adnexal structures. Color and duplex Doppler ultrasound was utilized to evaluate blood flow to the ovaries. COMPARISON:  Pelvic ultrasound March 28, 2021 FINDINGS: Uterus Measurements: 8.0 x 3.6 x 4.1 cm = volume: 61.9 mL. No fibroids or other mass visualized. Endometrium Thickness: 2 mm.  No focal abnormality visualized. Right ovary Measurements: 3.4 x 1.8 x 3.3 cm = volume: 10.3 mL. Normal appearance/no adnexal mass. Left ovary Measurements: 3.5 x 1.7 x 2.5 cm = volume: 7.8 mL. Normal appearance/no adnexal mass. Pulsed Doppler evaluation of both ovaries demonstrates normal low-resistance arterial and venous waveforms. Other findings Trace fluid in the pelvis. IMPRESSION: No acute process. Electronically Signed   By: Francis Gaines.D.  On: 03/18/2022 17:44   [place] Art/Ven Flow Abd Pelv Doppler  Result Date: 03/18/2022 CLINICAL DATA:  Patient with pelvic pain status post tubal ligation and ablation. EXAM: TRANSABDOMINAL AND TRANSVAGINAL ULTRASOUND OF PELVIS DOPPLER ULTRASOUND OF OVARIES TECHNIQUE: Both transabdominal and transvaginal ultrasound examinations of the pelvis were performed. Transabdominal technique was performed for global imaging of the pelvis including uterus, ovaries, adnexal regions, and pelvic cul-de-sac. It was necessary to proceed with endovaginal exam following the transabdominal exam to visualize the adnexal structures. Color and duplex Doppler ultrasound was utilized to evaluate blood flow to the ovaries. COMPARISON:  Pelvic ultrasound March 28, 2021 FINDINGS: Uterus Measurements: 8.0 x 3.6 x 4.1 cm = volume: 61.9 mL. No fibroids or other mass visualized. Endometrium Thickness: 2 mm.  No focal abnormality visualized. Right ovary Measurements: 3.4 x 1.8 x 3.3 cm = volume: 10.3 mL. Normal appearance/no adnexal  mass. Left ovary Measurements: 3.5 x 1.7 x 2.5 cm = volume: 7.8 mL. Normal appearance/no adnexal mass. Pulsed Doppler evaluation of both ovaries demonstrates normal low-resistance arterial and venous waveforms. Other findings Trace fluid in the pelvis. IMPRESSION: No acute process. Electronically Signed   By: Annia Belt M.D.   On: 03/18/2022 17:44    Procedures Procedures   Medications Ordered in ED Medications - No data to display  ED Course/ Medical Decision Making/ A&P                           Medical Decision Making Amount and/or Complexity of Data Reviewed Labs: ordered. Radiology: ordered.  Risk Prescription drug management.   Yuritzy Zehring is a 36 y.o. female PMH pelvic pain, bilateral tubal ligation, chronic left-sided low back pain, GERD is presenting with suprapubic abdominal pain, urinary frequency, nausea, and one episode of emesis.    Vitals at presentation within normal limits.  Patient is hemodynamically stable, afebrile, satting well on room air.  Physical exam reassuring.  Normal heart sounds.  Lungs clear to auscultation bilaterally.  Abdomen is soft, mildly tender to palpation suprapubic region without rebound or guarding.  No pitting edema.  Initial differential includes but is not limited to: Cystitis, pyelonephritis, STI, pelvic inflammatory disease, ovarian torsion, appendicitis, bowel obstruction, pregnancy  Lab work obtained, resulted notable for CBC without leukocytosis.  Hemoglobin 11.5, consistent with prior.  CMP with no significant derangements.  No evidence of AKI.  No elevated LFTs.  No anion gap.  Lipase normal.  Beta hCG negative.  UA not consistent with infectious etiology.  Given concerning findings on physical exam including tenderness to palpation suprapubic region, significant cervical motion tenderness, cervical discharge, and left adnexal tenderness, imaging was pursued. Resulted notable for ***.  Interventions include PO norco  ***The  patient is safe and stable for discharge at this time with return precautions provided and a plan for follow-up care in place as needed***  ***We will admit the patient to *** for ***  The plan for this patient was discussed with Dr. ***, who voiced agreement and who oversaw evaluation and treatment of this patient.    {Document critical care time when appropriate:1} {Document review of labs and clinical decision tools ie heart score, Chads2Vasc2 etc:1}  {Document your independent review of radiology images, and any outside records:1} {Document your discussion with family members, caretakers, and with consultants:1} {Document social determinants of health affecting pt's care:1} {Document your decision making why or why not admission, treatments were needed:1} Final Clinical Impression(s) / ED Diagnoses Final diagnoses:  None    Rx / DC Orders ED Discharge Orders     None

## 2022-03-20 ENCOUNTER — Other Ambulatory Visit (INDEPENDENT_AMBULATORY_CARE_PROVIDER_SITE_OTHER): Payer: Self-pay | Admitting: Family Medicine

## 2022-03-20 ENCOUNTER — Other Ambulatory Visit: Payer: Self-pay | Admitting: Family Medicine

## 2022-03-20 DIAGNOSIS — R300 Dysuria: Secondary | ICD-10-CM

## 2022-04-11 ENCOUNTER — Other Ambulatory Visit: Payer: Self-pay

## 2022-04-11 ENCOUNTER — Encounter: Payer: Self-pay | Admitting: Family Medicine

## 2022-04-11 ENCOUNTER — Ambulatory Visit (INDEPENDENT_AMBULATORY_CARE_PROVIDER_SITE_OTHER): Payer: Self-pay | Admitting: Family Medicine

## 2022-04-11 DIAGNOSIS — R102 Pelvic and perineal pain: Secondary | ICD-10-CM

## 2022-04-11 NOTE — Progress Notes (Signed)
   Subjective:    Patient ID: [name] is a 36 y.o. female presenting with Follow-up  on 04/11/2022  HPI: Notes some pelvic pain worse with bending over. Feels like it might be cyclical though occurs every week. Has had some bleeding. She is s/p lap tubal with filshies, no pregnancy x 4 years. CT shows that they might have migrated and showed possible ruptured follicle. She is s/p HTA with minimal bleeding. Pain was associated with that last time. Did take doxy, from ED and seemed to help some though it makes her nauseous. U/s was negative for issue.  Review of Systems  Constitutional:  Negative for chills and fever.  Respiratory:  Negative for shortness of breath.   Cardiovascular:  Negative for chest pain.  Gastrointestinal:  Negative for abdominal pain, nausea and vomiting.  Genitourinary:  Negative for dysuria.  Skin:  Negative for rash.      Objective:    BP 124/72   Pulse 79   Wt 141 lb 14.4 oz (64.4 kg)   BMI 28.66 kg/m  Physical Exam Exam conducted with a chaperone present.  Constitutional:      General: She is not in acute distress.    Appearance: She is well-developed.  HENT:     Head: Normocephalic and atraumatic.  Eyes:     General: No scleral icterus. Cardiovascular:     Rate and Rhythm: Normal rate.  Pulmonary:     Effort: Pulmonary effort is normal.  Abdominal:     Palpations: Abdomen is soft.  Musculoskeletal:     Cervical back: Neck supple.  Skin:    General: Skin is warm and dry.  Neurological:     Mental Status: She is alert and oriented to person, place, and time.         Assessment & Plan:   Problem List Items Addressed This Visit       Unprioritized   Pelvic pain    Has possible adenomyosis on u/s--though should be cyclical--to keep symptom diary--Ibuprofen prn.      Return in about 6 months (around 10/10/2022), or if symptoms worsen or fail to improve, for a CPE.  Reva Bores, MD 04/11/2022 1:28 PM

## 2022-04-11 NOTE — Assessment & Plan Note (Signed)
Has possible adenomyosis on u/s--though should be cyclical--to keep symptom diary--Ibuprofen prn.

## 2023-01-20 ENCOUNTER — Ambulatory Visit: Payer: Self-pay | Admitting: Obstetrics and Gynecology

## 2023-07-23 DIAGNOSIS — L03011 Cellulitis of right finger: Secondary | ICD-10-CM | POA: Diagnosis not present

## 2023-07-23 DIAGNOSIS — M79644 Pain in right finger(s): Secondary | ICD-10-CM | POA: Diagnosis not present

## 2023-07-23 DIAGNOSIS — M7989 Other specified soft tissue disorders: Secondary | ICD-10-CM | POA: Diagnosis not present
